# Patient Record
Sex: Female | Born: 1975 | Race: White | Hispanic: No | Marital: Married | Smoking: Never smoker
Health system: Southern US, Community
[De-identification: ages and names within clinical notes are randomized; demographics above are authoritative.]

## PROBLEM LIST (undated history)

## (undated) DIAGNOSIS — R51 Headache: Secondary | ICD-10-CM

## (undated) DIAGNOSIS — N946 Dysmenorrhea, unspecified: Secondary | ICD-10-CM

## (undated) DIAGNOSIS — N939 Abnormal uterine and vaginal bleeding, unspecified: Secondary | ICD-10-CM

## (undated) DIAGNOSIS — N800 Endometriosis of uterus: Secondary | ICD-10-CM

## (undated) DIAGNOSIS — Z90710 Acquired absence of both cervix and uterus: Secondary | ICD-10-CM

## (undated) HISTORY — PX: ENDOMETRIAL ABLATION: SHX621

## (undated) HISTORY — PX: ABDOMINAL HYSTERECTOMY: SHX81

## (undated) HISTORY — PX: TUBAL LIGATION: SHX77

---

## 2005-06-15 ENCOUNTER — Encounter (INDEPENDENT_AMBULATORY_CARE_PROVIDER_SITE_OTHER): Payer: Self-pay | Admitting: General Surgery

## 2005-06-15 ENCOUNTER — Ambulatory Visit (HOSPITAL_COMMUNITY): Admission: RE | Admit: 2005-06-15 | Discharge: 2005-06-15 | Payer: Self-pay | Admitting: General Surgery

## 2006-08-28 HISTORY — PX: LIPOMA EXCISION: SHX5283

## 2007-03-06 ENCOUNTER — Ambulatory Visit (HOSPITAL_COMMUNITY): Admission: RE | Admit: 2007-03-06 | Discharge: 2007-03-06 | Payer: Self-pay | Admitting: Obstetrics and Gynecology

## 2011-01-05 ENCOUNTER — Ambulatory Visit (HOSPITAL_COMMUNITY)
Admission: RE | Admit: 2011-01-05 | Discharge: 2011-01-05 | Disposition: A | Discharge status: Home / Self Care | Payer: BC Managed Care – PPO | Source: Ambulatory Visit | Attending: Pulmonary Disease | Admitting: Pulmonary Disease

## 2011-01-05 ENCOUNTER — Other Ambulatory Visit (HOSPITAL_COMMUNITY): Payer: Self-pay | Admitting: Pulmonary Disease

## 2011-01-05 DIAGNOSIS — M545 Low back pain, unspecified: Secondary | ICD-10-CM | POA: Insufficient documentation

## 2011-01-10 NOTE — Op Note (Signed)
NAMESCHARLENE, [name]                ACCOUNT NO.:  [id number]   MEDICAL RECORD NO.:  [id number]          PATIENT TYPE:  AMB   LOCATION:  SDC                           FACILITY:  [facility]   PHYSICIAN:  Sherron Monday, MD        DATE OF BIRTH:  04/08/1976   DATE OF PROCEDURE:  03/06/2007  DATE OF DISCHARGE:                               OPERATIVE REPORT   PREOPERATIVE DIAGNOSIS:  Menorrhagia, undesired fertility.   POSTOPERATIVE DIAGNOSIS:  Menorrhagia, undesired fertility.   PROCEDURE:  Bilateral tubal ligation by fulguration, ThermaChoice  endometrial ablation.   SURGEON:  Sherron Monday, M.D.   ASSISTANT:  None.   ANESTHESIA:  General as well as local.   SPECIMENS:  None.   ESTIMATED BLOOD LOSS:  Minimal.   IV FLUIDS:  1200 mL   URINE OUTPUT:  I&O cath prior to the procedure.   COMPLICATIONS:  None.   DISPOSITION:  Stable to PACU following the procedure.   PROCEDURE:  After informed consent was reviewed with the patient  including risks, benefits, alternatives of surgical procedure.  She is  taken to the OR room and placed on the table in supine position.  General anesthesia was induced found to be adequate and she was placed  in the yellow fin stirrups, prepped and draped in the normal sterile  fashion.  Her bladder was drained and a open-sided speculum was used to  visualize her uterus and the uterine manipulator was placed without  difficulty.  Gloves were changed and attention was turned to the  abdominal portion of the case. An approximately 10 mm vertical umbilical  incision was made using the Veress needle.  Pneumoperitoneum was  obtained in the typical fashion with testing of peritoneal entry was  with the hanging drop test. Using direct entry the 10 mm scope was  placed at the umbilicus.  She was placed in Trendelenburg. Her anatomy  was reviewed and her pelvic anatomy was visualized.  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 The tubes were  both  visualized followed out to the fimbriated end.  Attention was turned to  the fulguration of the tubes in typical fashion.  The right tube  initially was burned and fulgurized in the three burn technique.  Hemostasis was assured. Attention was then turned to the left tube which  was burned in the three burn technique. Gas was evacuated from the  abdomen and the trocar was removed. The deep tissue was closed with the  0 Vicryl.  The skin was closed with 3-0 Vicryl in a subcuticular  fashion.  The bandage was placed on umbilicus. The attention was then  turned to the vaginal portion of the case.  The uterine manipulator was  removed, bimanual exam was performed revealing a mid position of the  uterus. Uterus was sounded to 8 cm in both horns. The ThermaChoice  correct pressure was obtained.  The ThermaChoice was placed in the  uterus and a pressure of 160-180 was obtained.  The ThermaChoice set at  8 minutes cycle, after the fluid  was cooled it was removed and she was  noted to have minimal bleeding from site of the single-tooth tenaculum.  Pressure was held.  This was noted to be stopped.  She was awoken in  stable condition and transferred to the PACU following procedure.      Sherron Monday, MD  Electronically Signed     JB/MEDQ  D:  03/06/2007  T:  03/06/2007  Job:  [id number]

## 2011-01-10 NOTE — H&P (Signed)
[name], [name]                ACCOUNT NO.:  [id number]   MEDICAL RECORD NO.:  [id number]          PATIENT TYPE:  AMB   LOCATION:  SDC                           [facility]ACILITY:  [facility]   PHYSICIAN:  Sherron Monday, MD        DATE OF BIRTH:  1976-05-31   DATE OF ADMISSION:  03/06/2007  DATE OF DISCHARGE:                              HISTORY & PHYSICAL   PROCEDURE:  ThermaChoice endometrial ablation and bilateral tubal  ligation by fulguration.   HISTORY OF PRESENT ILLNESS:  This 35 year old with heavy menstrual  cycles, desiring relief and permanent sterilization.  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  We discussed  ThermaChoice and investigated that this is not a contraindication for  this procedure.   PAST MEDICAL HISTORY:  Significant for her bicornuate uterus.   PAST SURGICAL HISTORY:  1. Significant for a low transverse cesarean section.  2. Lipoma removal from her back.   OB/GYN HISTORY:  She is a G 2 P 2, status post a cesarean section for  breech, and a vaginal delivery.  She has no history of any abnormal Pap  smears, no sexually transmitted diseases.   ALLERGIES:  No known drug allergies.   SOCIAL HISTORY:  Denies alcohol, tobacco or drug use.  She is married  and works for the [employer].   FAMILY HISTORY:  Significant for hypertension in her mother and father.  No diabetes, no coronary artery disease.  History of paternal  grandfather with pancreatic cancer.  No breast, colon or ovarian cancer  in her family.   PHYSICAL EXAMINATION:  VITAL SIGNS:  Afebrile.  Vital signs stable.  GENERAL:  In no apparent distress.  HEENT:  Mucous membranes moist.  NECK:  No lymphadenopathy.  Thyroid  within normal limits.  HEART:  A regular rate and rhythm.  LUNGS:  Clear to auscultation bilaterally.  BACK:  No costovertebral angle tenderness.  BREASTS:  No masses, nontender.  No distortion.  ABDOMEN:  Soft, nontender, non-distended.  Positive bowel sounds.  EXTREMITIES:  Symmetric and nontender.  PELVIC:  Normal external female genitalia.  Normal Bartholin's, urethral  and Skene's.  Cervix and vaginal without lesions.  No cervical motion  tenderness.  Normal uterus, normal adnexa.   ASSESSMENT/PLAN:  As mentioned, the patient desires an endometrial  ablation for menorrhagia and a permanent sterilization.  We did discuss  the risks, benefits and alternatives of the surgical procedure,  including bleeding, infection, damage to surrounding organs, as well as  a failure rate with the tubal ligation.  She voices understanding and  wishes to proceed with a ThermaChoice and a bilateral tubal ligation.      Sherron Monday, MD  Electronically Signed     JB/MEDQ  D:  03/05/2007  T:  03/05/2007  Job:  [id number]

## 2011-01-13 NOTE — Op Note (Signed)
[name], [name]                ACCOUNT NO.:  [id number]   MEDICAL RECORD NO.:  [id number]          PATIENT TYPE:  AMB   LOCATION:  DAY                           FACILITY:  [facility]   PHYSICIAN:  Dalia Heading, M.D.  DATE OF BIRTH:  05-Aug-1976   DATE OF PROCEDURE:  06/15/2005  DATE OF DISCHARGE:                                 OPERATIVE REPORT   PREOPERATIVE DIAGNOSIS:  Neoplasm, back.   POSTOPERATIVE DIAGNOSIS:  Neoplasm, back.   PROCEDURE:  Excision of subcutaneous neoplasm, back.   SURGEON:  Dr. Franky Macho.   ANESTHESIA:  General endotracheal.   INDICATIONS:  The patient is a 35 year old white female who presents with an  enlarging subcutaneous mass along the mid left back. The patient now comes  the operating room for excision of the mass. Risks and benefits of the  procedure were fully explained to the patient, who gave informed consent.   PROCEDURE NOTE:  The patient was placed in the right lateral decubitus  position after induction of general endotracheal anesthesia. The back was  prepped and draped using the usual sterile technique with Betadine. Surgical  site confirmation was performed.   A longitudinal incision was made over the mass which measured greater than 8  cm in its greatest diameter. The dissection was taken down to the mass. The  mass was excised without difficulty. It appeared to be a lipoma. Further  pathology is pending. The wound was irrigated normal saline. Bleeding was  controlled using Bovie electrocautery. The subcutaneous layer was  reapproximated using a 3-0 Vicryl interrupted suture. The skin was closed  using a 4-0 Vicryl subcuticular suture. Sensorcaine 0.5% was instilled into  the surrounding wound. Dermabond was then applied.   All tape and needle counts were correct at the end of the procedure. The  patient was extubated in the operating room and went back to recovery room  awake in stable condition.   COMPLICATIONS:  None.   SPECIMEN:  Mass, back.   BLOOD LOSS:  Minimal.      Dalia Heading, M.D.  Electronically Signed     MAJ/MEDQ  D:  06/15/2005  T:  06/15/2005  Job:  [id number]   cc:   Ramon Dredge L. Juanetta Gosling, M.D.  Fax: [phone]

## 2011-01-13 NOTE — H&P (Signed)
[name], [name]                ACCOUNT NO.:  [id number]   MEDICAL RECORD NO.:  [id number]          PATIENT TYPE:  AMB   LOCATION:  DAY                           FACILITY:  [facility]   PHYSICIAN:  [name], M.D.  DATE OF BIRTH:  February 23, 1976   DATE OF ADMISSION:  DATE OF DISCHARGE:  LH                                HISTORY & PHYSICAL   CHIEF COMPLAINT:  Mass, back.   HISTORY OF PRESENT ILLNESS:  The patient is a 35 year old white female who  is referred for evaluation and treatment of a mass on her back. It has been  present for some time but recently has increased in size and is causing some  discomfort.   PAST MEDICAL HISTORY:  Unremarkable.   PAST SURGICAL HISTORY:  Cesarean section.   CURRENT MEDICATIONS:  Birth control pills.   ALLERGIES:  No known drug allergies.   REVIEW OF SYSTEMS:  Noncontributory.   PHYSICAL EXAMINATION:  GENERAL:  The patient is a well-developed, well-  nourished, white female in no acute distress.  LUNGS:  Clear to auscultation without equal breath sounds bilaterally.  HEART:  Reveals a regular _______________ noted along the left mid back.   IMPRESSION:  Neoplasm, back, unspecified.   PLAN:  The patient is scheduled for excision of the neoplasm, back, on  June 15, 2005. The risks and benefits of the procedure including  bleeding, infection, and recurrence of the mass were fully explained to the  patient who gave informed consent.      [name], M.D.  Electronically Signed     MAJ/MEDQ  D:  06/13/2005  T:  06/13/2005  Job:  [id number]   cc:   Ramon Dredge L. Juanetta Gosling, M.D.  Fax: [phone]   [facility]  Fax: [phone]

## 2011-06-13 LAB — CBC
MCHC: 33.5
MCV: 79
RDW: 14.2 — ABNORMAL HIGH

## 2011-06-29 ENCOUNTER — Other Ambulatory Visit (HOSPITAL_COMMUNITY): Payer: Self-pay | Admitting: Physical Medicine and Rehabilitation

## 2011-06-29 DIAGNOSIS — IMO0002 Reserved for concepts with insufficient information to code with codable children: Secondary | ICD-10-CM

## 2011-06-29 DIAGNOSIS — M5137 Other intervertebral disc degeneration, lumbosacral region: Secondary | ICD-10-CM

## 2011-06-30 ENCOUNTER — Ambulatory Visit (HOSPITAL_COMMUNITY)
Admission: RE | Admit: 2011-06-30 | Discharge: 2011-06-30 | Disposition: A | Discharge status: Home / Self Care | Payer: BC Managed Care – PPO | Source: Ambulatory Visit | Attending: Physical Medicine and Rehabilitation | Admitting: Physical Medicine and Rehabilitation

## 2011-06-30 DIAGNOSIS — M545 Low back pain, unspecified: Secondary | ICD-10-CM | POA: Insufficient documentation

## 2011-06-30 DIAGNOSIS — M546 Pain in thoracic spine: Secondary | ICD-10-CM | POA: Insufficient documentation

## 2011-06-30 DIAGNOSIS — M5137 Other intervertebral disc degeneration, lumbosacral region: Secondary | ICD-10-CM

## 2011-06-30 DIAGNOSIS — IMO0002 Reserved for concepts with insufficient information to code with codable children: Secondary | ICD-10-CM

## 2011-10-27 ENCOUNTER — Encounter (HOSPITAL_COMMUNITY): Payer: Self-pay

## 2011-11-08 ENCOUNTER — Encounter (HOSPITAL_COMMUNITY)
Admission: RE | Admit: 2011-11-08 | Discharge: 2011-11-08 | Disposition: A | Discharge status: Home / Self Care | Payer: BC Managed Care – PPO | Source: Ambulatory Visit | Attending: Obstetrics and Gynecology | Admitting: Obstetrics and Gynecology

## 2011-11-08 ENCOUNTER — Encounter (HOSPITAL_COMMUNITY): Payer: Self-pay

## 2011-11-08 HISTORY — DX: Headache: R51

## 2011-11-08 LAB — CBC
HCT: 36.1 % (ref 36.0–46.0)
Hemoglobin: 12.5 g/dL (ref 12.0–15.0)
MCH: 29.7 pg (ref 26.0–34.0)
MCV: 85.7 fL (ref 78.0–100.0)
RBC: 4.21 MIL/uL (ref 3.87–5.11)
WBC: 8.2 10*3/uL (ref 4.0–10.5)

## 2011-11-08 NOTE — Patient Instructions (Addendum)
20 [name]  11/08/2011   Your procedure is scheduled on:  11/16/11  Enter through the Main Entrance of [facility] at 830 AM.  Pick up the phone at the desk and dial [phone].   Call this number if you have problems the morning of surgery: [phone]   Remember:   Do not eat food:After Midnight.  Do not drink clear liquids: After Midnight.  Take these medicines the morning of surgery with A SIP OF WATER: Topamax   Do not wear jewelry, make-up or nail polish.  Do not wear lotions, powders, or perfumes. You may wear deodorant.  Do not shave 48 hours prior to surgery.  Do not bring valuables to the hospital.  Contacts, dentures or bridgework may not be worn into surgery.  Leave suitcase in the car. After surgery it may be brought to your room.  For patients admitted to the hospital, checkout time is 11:00 AM the day of discharge.   Patients discharged the day of surgery will not be allowed to drive home.  Name and phone number of your driver: NA  Special Instructions: CHG Shower Use Special Wash: 1/2 bottle night before surgery and 1/2 bottle morning of surgery.   Please read over the following fact sheets that you were given: MRSA Information

## 2011-11-16 ENCOUNTER — Ambulatory Visit (HOSPITAL_COMMUNITY)
Admission: RE | Admit: 2011-11-16 | Discharge: 2011-11-17 | Disposition: A | Discharge status: Home / Self Care | Payer: BC Managed Care – PPO | Source: Ambulatory Visit | Attending: Obstetrics and Gynecology | Admitting: Obstetrics and Gynecology

## 2011-11-16 ENCOUNTER — Encounter (HOSPITAL_COMMUNITY): Payer: Self-pay | Admitting: Registered Nurse

## 2011-11-16 ENCOUNTER — Encounter (HOSPITAL_COMMUNITY): Payer: Self-pay | Admitting: Obstetrics and Gynecology

## 2011-11-16 ENCOUNTER — Ambulatory Visit (HOSPITAL_COMMUNITY): Payer: BC Managed Care – PPO | Admitting: Registered Nurse

## 2011-11-16 ENCOUNTER — Encounter (HOSPITAL_COMMUNITY)
Admission: RE | Disposition: A | Discharge status: Home / Self Care | Payer: Self-pay | Source: Ambulatory Visit | Attending: Obstetrics and Gynecology

## 2011-11-16 DIAGNOSIS — N938 Other specified abnormal uterine and vaginal bleeding: Secondary | ICD-10-CM | POA: Insufficient documentation

## 2011-11-16 DIAGNOSIS — Q513 Bicornate uterus: Secondary | ICD-10-CM

## 2011-11-16 DIAGNOSIS — Z90710 Acquired absence of both cervix and uterus: Secondary | ICD-10-CM

## 2011-11-16 DIAGNOSIS — N8000 Endometriosis of the uterus, unspecified: Secondary | ICD-10-CM | POA: Insufficient documentation

## 2011-11-16 DIAGNOSIS — N949 Unspecified condition associated with female genital organs and menstrual cycle: Secondary | ICD-10-CM | POA: Insufficient documentation

## 2011-11-16 DIAGNOSIS — N840 Polyp of corpus uteri: Secondary | ICD-10-CM

## 2011-11-16 DIAGNOSIS — Z01812 Encounter for preprocedural laboratory examination: Secondary | ICD-10-CM | POA: Insufficient documentation

## 2011-11-16 DIAGNOSIS — N939 Abnormal uterine and vaginal bleeding, unspecified: Secondary | ICD-10-CM

## 2011-11-16 DIAGNOSIS — Z01818 Encounter for other preprocedural examination: Secondary | ICD-10-CM | POA: Insufficient documentation

## 2011-11-16 DIAGNOSIS — N8003 Adenomyosis of the uterus: Secondary | ICD-10-CM | POA: Diagnosis present

## 2011-11-16 DIAGNOSIS — N946 Dysmenorrhea, unspecified: Secondary | ICD-10-CM | POA: Insufficient documentation

## 2011-11-16 HISTORY — DX: Dysmenorrhea, unspecified: N94.6

## 2011-11-16 HISTORY — DX: Polyp of corpus uteri: N84.0

## 2011-11-16 HISTORY — DX: Endometriosis of uterus: N80.0

## 2011-11-16 HISTORY — DX: Endometriosis of the uterus, unspecified: N80.00

## 2011-11-16 HISTORY — DX: Acquired absence of both cervix and uterus: Z90.710

## 2011-11-16 HISTORY — DX: Abnormal uterine and vaginal bleeding, unspecified: N93.9

## 2011-11-16 HISTORY — PX: LAPAROSCOPIC ASSISTED VAGINAL HYSTERECTOMY: SHX5398

## 2011-11-16 HISTORY — DX: Bicornate uterus: Q51.3

## 2011-11-16 HISTORY — PX: SALPINGOOPHORECTOMY: SHX82

## 2011-11-16 LAB — BASIC METABOLIC PANEL
Calcium: 9.2 mg/dL (ref 8.4–10.5)
Chloride: 108 mEq/L (ref 96–112)
Creatinine, Ser: 0.59 mg/dL (ref 0.50–1.10)
GFR calc Af Amer: >90 mL/min (ref >90)
Sodium: 139 mEq/L (ref 135–145)

## 2011-11-16 LAB — PREGNANCY, URINE: Preg Test, Ur: NEGATIVE

## 2011-11-16 SURGERY — HYSTERECTOMY, VAGINAL, LAPAROSCOPY-ASSISTED
Anesthesia: General | Site: Abdomen | Wound class: Clean Contaminated

## 2011-11-16 MED ORDER — PROPOFOL 10 MG/ML IV EMUL
INTRAVENOUS | Status: DC | PRN
  Administered 2011-11-16: 170 mg via INTRAVENOUS

## 2011-11-16 MED ORDER — MORPHINE SULFATE 10 MG/ML IJ SOLN
INTRAMUSCULAR | Status: AC
  Filled 2011-11-16: qty 1

## 2011-11-16 MED ORDER — SODIUM CHLORIDE 0.9 % IJ SOLN: 9.0000 mL | INTRAMUSCULAR | Status: DC | PRN

## 2011-11-16 MED ORDER — EPHEDRINE SULFATE 50 MG/ML IJ SOLN
10.0000 mg | Freq: Once | INTRAMUSCULAR | Status: AC
  Administered 2011-11-16: 10 mg via INTRAVENOUS

## 2011-11-16 MED ORDER — BUPIVACAINE HCL (PF) 0.25 % IJ SOLN
INTRAMUSCULAR | Status: AC
  Filled 2011-11-16: qty 30

## 2011-11-16 MED ORDER — MEPERIDINE HCL 25 MG/ML IJ SOLN: 6.2500 mg | INTRAMUSCULAR | Status: DC | PRN

## 2011-11-16 MED ORDER — INDIGOTINDISULFONATE SODIUM 8 MG/ML IJ SOLN
INTRAMUSCULAR | Status: AC
  Filled 2011-11-16: qty 5

## 2011-11-16 MED ORDER — CEFAZOLIN SODIUM-DEXTROSE 2-3 GM-% IV SOLR
2.0000 g | INTRAVENOUS | Status: AC
  Administered 2011-11-16: 2 g via INTRAVENOUS
  Filled 2011-11-16: qty 50

## 2011-11-16 MED ORDER — GLYCOPYRROLATE 0.2 MG/ML IJ SOLN
INTRAMUSCULAR | Status: AC
  Filled 2011-11-16: qty 1

## 2011-11-16 MED ORDER — HYDROMORPHONE HCL PF 1 MG/ML IJ SOLN
0.2500 mg | INTRAMUSCULAR | Status: DC | PRN
  Administered 2011-11-16: 0.5 mg via INTRAVENOUS

## 2011-11-16 MED ORDER — LIDOCAINE HCL (CARDIAC) 20 MG/ML IV SOLN
INTRAVENOUS | Status: DC | PRN
  Administered 2011-11-16: 60 mg via INTRAVENOUS

## 2011-11-16 MED ORDER — IBUPROFEN 800 MG PO TABS
800.0000 mg | ORAL_TABLET | Freq: Three times a day (TID) | ORAL | Status: DC | PRN
  Administered 2011-11-17: 800 mg via ORAL
  Filled 2011-11-16: qty 1

## 2011-11-16 MED ORDER — FENTANYL CITRATE 0.05 MG/ML IJ SOLN
INTRAMUSCULAR | Status: DC | PRN
  Administered 2011-11-16: 50 ug via INTRAVENOUS
  Administered 2011-11-16: 150 ug via INTRAVENOUS
  Administered 2011-11-16: 50 ug via INTRAVENOUS

## 2011-11-16 MED ORDER — ALUM & MAG HYDROXIDE-SIMETH 200-200-20 MG/5ML PO SUSP: 30.0000 mL | ORAL | Status: DC | PRN

## 2011-11-16 MED ORDER — ONDANSETRON HCL 4 MG/2ML IJ SOLN
INTRAMUSCULAR | Status: DC | PRN
  Administered 2011-11-16: 4 mg via INTRAVENOUS

## 2011-11-16 MED ORDER — OXYCODONE-ACETAMINOPHEN 5-325 MG PO TABS: 1.0000 | ORAL_TABLET | ORAL | Status: DC | PRN

## 2011-11-16 MED ORDER — GUAIFENESIN 100 MG/5ML PO SOLN: 15.0000 mL | ORAL | Status: DC | PRN

## 2011-11-16 MED ORDER — LACTATED RINGERS IV SOLN
INTRAVENOUS | Status: DC
  Administered 2011-11-16 (×3): via INTRAVENOUS

## 2011-11-16 MED ORDER — ZOLPIDEM TARTRATE 5 MG PO TABS: 5.0000 mg | ORAL_TABLET | Freq: Every evening | ORAL | Status: DC | PRN

## 2011-11-16 MED ORDER — HYDROMORPHONE HCL PF 1 MG/ML IJ SOLN
INTRAMUSCULAR | Status: AC
  Filled 2011-11-16: qty 1

## 2011-11-16 MED ORDER — KETOROLAC TROMETHAMINE 30 MG/ML IJ SOLN: 15.0000 mg | Freq: Once | INTRAMUSCULAR | Status: DC | PRN

## 2011-11-16 MED ORDER — NALOXONE HCL 0.4 MG/ML IJ SOLN: 0.4000 mg | INTRAMUSCULAR | Status: DC | PRN

## 2011-11-16 MED ORDER — MORPHINE SULFATE 10 MG/ML IJ SOLN
INTRAMUSCULAR | Status: DC | PRN
  Administered 2011-11-16 (×2): 5 mg via INTRAVENOUS

## 2011-11-16 MED ORDER — DEXAMETHASONE SODIUM PHOSPHATE 10 MG/ML IJ SOLN
INTRAMUSCULAR | Status: DC | PRN
  Administered 2011-11-16: 10 mg via INTRAVENOUS

## 2011-11-16 MED ORDER — LACTATED RINGERS IV SOLN
INTRAVENOUS | Status: DC
  Administered 2011-11-16: 14:00:00 via INTRAVENOUS

## 2011-11-16 MED ORDER — MIDAZOLAM HCL 5 MG/5ML IJ SOLN
INTRAMUSCULAR | Status: DC | PRN
  Administered 2011-11-16: 2 mg via INTRAVENOUS

## 2011-11-16 MED ORDER — ONDANSETRON HCL 4 MG/2ML IJ SOLN: 4.0000 mg | Freq: Four times a day (QID) | INTRAMUSCULAR | Status: DC | PRN

## 2011-11-16 MED ORDER — PROPOFOL 10 MG/ML IV EMUL
INTRAVENOUS | Status: AC
  Filled 2011-11-16: qty 20

## 2011-11-16 MED ORDER — MENTHOL 3 MG MT LOZG: 1.0000 | LOZENGE | OROMUCOSAL | Status: DC | PRN

## 2011-11-16 MED ORDER — DIPHENHYDRAMINE HCL 50 MG/ML IJ SOLN: 12.5000 mg | Freq: Four times a day (QID) | INTRAMUSCULAR | Status: DC | PRN

## 2011-11-16 MED ORDER — GLYCOPYRROLATE 0.2 MG/ML IJ SOLN
INTRAMUSCULAR | Status: DC | PRN
  Administered 2011-11-16: 0.3 mg via INTRAVENOUS

## 2011-11-16 MED ORDER — NEOSTIGMINE METHYLSULFATE 1 MG/ML IJ SOLN
INTRAMUSCULAR | Status: DC | PRN
  Administered 2011-11-16: 2 mg via INTRAVENOUS

## 2011-11-16 MED ORDER — MIDAZOLAM HCL 2 MG/2ML IJ SOLN
INTRAMUSCULAR | Status: AC
  Filled 2011-11-16: qty 2

## 2011-11-16 MED ORDER — TOPIRAMATE 25 MG PO TABS
75.0000 mg | ORAL_TABLET | Freq: Two times a day (BID) | ORAL | Status: DC
  Administered 2011-11-16 – 2011-11-17 (×3): 75 mg via ORAL
  Filled 2011-11-16 (×3): qty 3

## 2011-11-16 MED ORDER — ONDANSETRON HCL 4 MG/2ML IJ SOLN
INTRAMUSCULAR | Status: AC
  Filled 2011-11-16: qty 2

## 2011-11-16 MED ORDER — DIPHENHYDRAMINE HCL 12.5 MG/5ML PO ELIX: 12.5000 mg | ORAL_SOLUTION | Freq: Four times a day (QID) | ORAL | Status: DC | PRN

## 2011-11-16 MED ORDER — ROCURONIUM BROMIDE 100 MG/10ML IV SOLN
INTRAVENOUS | Status: DC | PRN
  Administered 2011-11-16: 40 mg via INTRAVENOUS

## 2011-11-16 MED ORDER — LIDOCAINE HCL (CARDIAC) 20 MG/ML IV SOLN
INTRAVENOUS | Status: AC
  Filled 2011-11-16: qty 5

## 2011-11-16 MED ORDER — ONDANSETRON HCL 4 MG/2ML IJ SOLN
4.0000 mg | Freq: Four times a day (QID) | INTRAMUSCULAR | Status: DC | PRN
  Administered 2011-11-16: 4 mg via INTRAVENOUS
  Filled 2011-11-16: qty 2

## 2011-11-16 MED ORDER — SIMETHICONE 80 MG PO CHEW: 80.0000 mg | CHEWABLE_TABLET | Freq: Four times a day (QID) | ORAL | Status: DC | PRN

## 2011-11-16 MED ORDER — ONDANSETRON HCL 4 MG PO TABS: 4.0000 mg | ORAL_TABLET | Freq: Four times a day (QID) | ORAL | Status: DC | PRN

## 2011-11-16 MED ORDER — ROCURONIUM BROMIDE 50 MG/5ML IV SOLN
INTRAVENOUS | Status: AC
  Filled 2011-11-16: qty 1

## 2011-11-16 MED ORDER — FENTANYL CITRATE 0.05 MG/ML IJ SOLN
INTRAMUSCULAR | Status: AC
  Filled 2011-11-16: qty 5

## 2011-11-16 MED ORDER — BUPIVACAINE HCL (PF) 0.25 % IJ SOLN
INTRAMUSCULAR | Status: DC | PRN
  Administered 2011-11-16: 9 mL

## 2011-11-16 MED ORDER — PROMETHAZINE HCL 25 MG/ML IJ SOLN: 6.2500 mg | INTRAMUSCULAR | Status: DC | PRN

## 2011-11-16 MED ORDER — MORPHINE SULFATE (PF) 1 MG/ML IV SOLN
INTRAVENOUS | Status: DC
  Administered 2011-11-16: 13 mg via INTRAVENOUS
  Administered 2011-11-16 (×2): via INTRAVENOUS
  Administered 2011-11-16: 22 mg via INTRAVENOUS
  Administered 2011-11-17: 3 mg via INTRAVENOUS
  Administered 2011-11-17: 05:00:00 via INTRAVENOUS
  Administered 2011-11-17: 8 mg via INTRAVENOUS
  Administered 2011-11-17: 9 mg via INTRAVENOUS
  Filled 2011-11-16 (×3): qty 25

## 2011-11-16 MED ORDER — EPHEDRINE SULFATE 50 MG/ML IJ SOLN
INTRAMUSCULAR | Status: AC
  Administered 2011-11-16: 10 mg via INTRAVENOUS
  Filled 2011-11-16: qty 1

## 2011-11-16 MED ORDER — DEXAMETHASONE SODIUM PHOSPHATE 10 MG/ML IJ SOLN
INTRAMUSCULAR | Status: AC
  Filled 2011-11-16: qty 1

## 2011-11-16 MED ORDER — KETOROLAC TROMETHAMINE 30 MG/ML IJ SOLN
INTRAMUSCULAR | Status: AC
  Filled 2011-11-16: qty 1

## 2011-11-16 MED ORDER — KETOROLAC TROMETHAMINE 30 MG/ML IJ SOLN
INTRAMUSCULAR | Status: DC | PRN
  Administered 2011-11-16: 30 mg via INTRAVENOUS

## 2011-11-16 SURGICAL SUPPLY — 35 items
CABLE HIGH FREQUENCY MONO STRZ (ELECTRODE) IMPLANT
CHLORAPREP W/TINT 26ML (MISCELLANEOUS) ×3 IMPLANT
CLOTH BEACON ORANGE TIMEOUT ST (SAFETY) ×3 IMPLANT
CONT PATH 16OZ SNAP LID 3702 (MISCELLANEOUS) ×3 IMPLANT
COVER TABLE BACK 60X90 (DRAPES) ×3 IMPLANT
DECANTER SPIKE VIAL GLASS SM (MISCELLANEOUS) ×3 IMPLANT
DRSG COVADERM PLUS 2X2 (GAUZE/BANDAGES/DRESSINGS) ×3 IMPLANT
ELECT REM PT RETURN 9FT ADLT (ELECTROSURGICAL) ×3
ELECTRODE REM PT RTRN 9FT ADLT (ELECTROSURGICAL) ×2 IMPLANT
EVACUATOR PREFILTER SMOKE (MISCELLANEOUS) ×3 IMPLANT
GLOVE BIO SURGEON STRL SZ 6.5 (GLOVE) ×12 IMPLANT
GLOVE BIO SURGEON STRL SZ7 (GLOVE) ×6 IMPLANT
GLOVE ORTHO TXT STRL SZ7.5 (GLOVE) ×3 IMPLANT
GOWN PREVENTION PLUS LG XLONG (DISPOSABLE) ×9 IMPLANT
GOWN SURGICAL XLG (GOWNS) ×12 IMPLANT
NEEDLE INSUFFLATION 14GA 120MM (NEEDLE) ×3 IMPLANT
NS IRRIG 1000ML POUR BTL (IV SOLUTION) ×3 IMPLANT
PACK LAVH (CUSTOM PROCEDURE TRAY) ×3 IMPLANT
PROTECTOR NERVE ULNAR (MISCELLANEOUS) ×3 IMPLANT
SCALPEL HARMONIC ACE (MISCELLANEOUS) ×3 IMPLANT
SET CYSTO W/LG BORE CLAMP LF (SET/KITS/TRAYS/PACK) IMPLANT
SET IRRIG TUBING LAPAROSCOPIC (IRRIGATION / IRRIGATOR) IMPLANT
STRIP CLOSURE SKIN 1/4X3 (GAUZE/BANDAGES/DRESSINGS) IMPLANT
SUT VIC AB 1 CT1 18XBRD ANBCTR (SUTURE) ×4 IMPLANT
SUT VIC AB 1 CT1 8-18 (SUTURE) ×2
SUT VIC AB 2-0 CT1 (SUTURE) ×3 IMPLANT
SUT VICRYL 0 TIES 12 18 (SUTURE) ×3 IMPLANT
SUT VICRYL 0 UR6 27IN ABS (SUTURE) IMPLANT
SUT VICRYL 4-0 PS2 18IN ABS (SUTURE) ×3 IMPLANT
SYR 50ML LL SCALE MARK (SYRINGE) ×3 IMPLANT
TOWEL OR 17X24 6PK STRL BLUE (TOWEL DISPOSABLE) ×9 IMPLANT
TRAY FOLEY CATH 14FR (SET/KITS/TRAYS/PACK) ×3 IMPLANT
TROCAR XCEL NON-BLD 5MMX100MML (ENDOMECHANICALS) ×9 IMPLANT
WARMER LAPAROSCOPE (MISCELLANEOUS) ×3 IMPLANT
WATER STERILE IRR 1000ML POUR (IV SOLUTION) ×3 IMPLANT

## 2011-11-16 NOTE — Anesthesia Postprocedure Evaluation (Signed)
Anesthesia Post Note  Patient: [name]  Procedure(s) Performed: Procedure(s) (LRB): LAPAROSCOPIC ASSISTED VAGINAL HYSTERECTOMY (N/A) SALPINGO OOPHERECTOMY (Bilateral)  Anesthesia type: General  Patient location: PACU  Post pain: Pain level controlled  Post assessment: Post-op Vital signs reviewed  Last Vitals:  Filed Vitals:   11/16/11 0841  BP: 137/88  Pulse: 80  Temp: 36.7 C  Resp: 18    Post vital signs: Reviewed  Level of consciousness: sedated  Complications: No apparent anesthesia complicationsfj

## 2011-11-16 NOTE — Preoperative (Signed)
Beta Blockers   Reason not to administer Beta Blockers:Not Applicable 

## 2011-11-16 NOTE — Progress Notes (Signed)
Patient ID: [name], female   DOB: 1976-08-10, 36 y.o.   MRN: [id number] VS stable Output good Urine is yellow. No complaints

## 2011-11-16 NOTE — Addendum Note (Signed)
Addendum  created 11/16/11 1626 by Graciela Husbands, CRNA   Modules edited:Notes Section

## 2011-11-16 NOTE — Transfer of Care (Signed)
Immediate Anesthesia Transfer of Care Note  Patient: [name]  Procedure(s) Performed: Procedure(s) (LRB): LAPAROSCOPIC ASSISTED VAGINAL HYSTERECTOMY (N/A) SALPINGO OOPHERECTOMY (Bilateral)  Patient Location: PACU  Anesthesia Type: General  Level of Consciousness: awake, alert  and patient cooperative  Airway & Oxygen Therapy: Patient Spontanous Breathing and Patient connected to nasal cannula oxygen  Post-op Assessment: Report given to PACU RN  Post vital signs: Reviewed  Complications: No apparent anesthesia complications

## 2011-11-16 NOTE — Brief Op Note (Signed)
11/16/2011  11:48 AM  PATIENT:  [name]  36 y.o. female  PRE-OPERATIVE DIAGNOSIS:  biounuate uterus, adenomyosis, dysmenorrhea, failed endometrial ablation  POST-OPERATIVE DIAGNOSIS:  biounuate uterus, adenomyosis, dysmenorrhea, failed endometrial ablation, s/p LAVH BSO  PROCEDURE:  Procedure(s) (LRB): LAPAROSCOPIC ASSISTED VAGINAL HYSTERECTOMY (N/A) SALPINGO OOPHERECTOMY (Bilateral)  SURGEON:  Surgeon(s) and Role:    * Sherron Monday, MD - Primary  ASSISTANTS: Lavina Hamman, MD   ANESTHESIA:   general  EBL:  Total I/O In: 2100 [I.V.:2100] Out: 350 [Urine:100; Blood:250]  BLOOD ADMINISTERED:none  DRAINS: none   LOCAL MEDICATIONS USED:  MARCAINE     SPECIMEN:  Source of Specimen:  uterus, cervix and Bilateral tubes and ovaries  DISPOSITION OF SPECIMEN:  PATHOLOGY  COUNTS:  YES  TOURNIQUET:  * No tourniquets in log *  DICTATION: .Other Dictation: Dictation Number [id number]  PLAN OF CARE: Admit for overnight observation  PATIENT DISPOSITION:  PACU - hemodynamically stable.   Delay start of Pharmacological VTE agent (>24hrs) due to surgical blood loss or risk of bleeding: not applicable

## 2011-11-16 NOTE — Anesthesia Preprocedure Evaluation (Signed)
Anesthesia Evaluation  Patient identified by MRN, date of birth, ID band Patient awake    Reviewed: Allergy & Precautions, H&P , NPO status , Patient's Chart, lab work & pertinent test results  Airway Mallampati: I TM Distance: >3 FB Neck ROM: full    Dental No notable dental hx. (+) Teeth Intact   Pulmonary neg pulmonary ROS,    Pulmonary exam normal       Cardiovascular negative cardio ROS      Neuro/Psych  Headaches, negative psych ROS   GI/Hepatic negative GI ROS, Neg liver ROS,   Endo/Other  negative endocrine ROS  Renal/GU negative Renal ROS  negative genitourinary   Musculoskeletal negative musculoskeletal ROS (+)   Abdominal Normal abdominal exam  (+)   Peds negative pediatric ROS (+)  Hematology negative hematology ROS (+)   Anesthesia Other Findings   Reproductive/Obstetrics negative OB ROS                           Anesthesia Physical Anesthesia Plan  ASA: II  Anesthesia Plan: General   Post-op Pain Management:    Induction: Intravenous  Airway Management Planned: Oral ETT  Additional Equipment:   Intra-op Plan:   Post-operative Plan: Extubation in OR  Informed Consent: I have reviewed the patients History and Physical, chart, labs and discussed the procedure including the risks, benefits and alternatives for the proposed anesthesia with the patient or authorized representative who has indicated his/her understanding and acceptance.   Dental Advisory Given  Plan Discussed with: CRNA and Surgeon  Anesthesia Plan Comments:         Anesthesia Quick Evaluation

## 2011-11-16 NOTE — Anesthesia Postprocedure Evaluation (Signed)
  Anesthesia Post-op Note  Patient: [name]  Procedure(s) Performed: Procedure(s) (LRB): LAPAROSCOPIC ASSISTED VAGINAL HYSTERECTOMY (N/A) SALPINGO OOPHERECTOMY (Bilateral)  Patient Location: 312  Anesthesia Type: General  Level of Consciousness: awake, alert  and oriented  Airway and Oxygen Therapy: Patient Spontanous Breathing and Patient connected to nasal cannula oxygen  Post-op Pain: mild  Post-op Assessment: Post-op Vital signs reviewed and Patient's Cardiovascular Status Stable  Post-op Vital Signs: Reviewed and stable  Complications: No apparent anesthesia complications

## 2011-11-16 NOTE — H&P (Addendum)
Anne-Marie Genson Kolodziejski is an 36 y.o. female G2P2002 with abnormal uterine bleeding, aednomyosis, and dysmenorrhea with failed endometrial ablation for LAVH +/- BSO.  Desires definitive management after discussion of R/B/A.    Pertinent Gynecological History: G2P2, with LTCS and VBAC With AUB, endo polyp, bicornuate uterus, failed endometrial ablation Noh/o abnormal pap, no STDs    Past Medical History  Diagnosis Date  . Headache     migraines  . Dysmenorrhea 11/16/2011  . Abnormal uterine bleeding 11/16/2011  . Uterus, adenomyosis 11/16/2011  back pain  Past Surgical History  Procedure Date  . Cesarean section 1995  . Lipoma excision 2008  . Tubal ligation   . Endometrial ablation     No family history on file.  Social History:  reports that she has never smoked. She does not have any smokeless tobacco history on file. She reports that she drinks alcohol. She reports that she does not use illicit drugs.married  Allergies: No Known Allergies  No prescriptions prior to admission    Review of Systems  Constitutional: Negative.   HENT: Negative.   Eyes: Negative.   Respiratory: Negative.   Cardiovascular: Negative.   Gastrointestinal: Negative.   Genitourinary: Negative.   Musculoskeletal: Negative.   Skin: Negative.   Neurological: Negative.     There were no vitals taken for this visit. Physical Exam  Constitutional: She is oriented to person, place, and time. She appears well-developed and well-nourished.  HENT:  Head: Normocephalic and atraumatic.  Neck: Normal range of motion. Neck supple. No thyromegaly present.  Cardiovascular: Normal rate and regular rhythm.   Respiratory: Effort normal and breath sounds normal.  GI: Soft. Bowel sounds are normal. She exhibits no distension.  Musculoskeletal: Normal range of motion.  Neurological: She is alert and oriented to person, place, and time.  Skin: Skin is warm and dry.  Psychiatric: She has a normal mood and affect. Her  behavior is normal.   Assessment/Plan: 35yo G2P2002 with AUB, dysmenorrhea, failed endo ablation for definitive management.  Desires LAVH +/- BSO after discussion of R/B/A, will proceed  BOVARD,Jaloni Davoli 11/16/2011, 12:27 AM  reviewed H&P no changes, reviewed surgery including r/b/a, Discussion of BSO and surgical menopause, PT states she definitely wants them removed.  Pt wants to proceed.  Discussion with husband and pt.

## 2011-11-16 NOTE — OR Nursing (Signed)
Late entry for patient postioning related to entry error.

## 2011-11-16 NOTE — Op Note (Signed)
NAMERICQUEL, FOULK                ACCOUNT NO.:  [id number]  MEDICAL RECORD NO.:  [id number]  LOCATION:  [facility]                          FACILITY:  [facility]  PHYSICIAN:  Sherron Monday, MD        DATE OF BIRTH:  1976/06/13  DATE OF PROCEDURE:  11/16/2011 DATE OF DISCHARGE:                              OPERATIVE REPORT   PREOPERATIVE DIAGNOSES: 1. Bicornuate uterus. 2. Adenomyosis. 3. Dysmenorrhea. 4. Failed endometrial ablation.  POSTOPERATIVE DIAGNOSES: 1. Bicornuate uterus. 2. Adenomyosis. 3. Dysmenorrhea. 4. Failed endometrial ablation.  PROCEDURES: 1. Laparoscopic-assisted vaginal hysterectomy. 2. Bilateral salpingectomy oophorectomy.  SURGEON:  Sherron Monday, MD.  ASSISTANT:  Zenaida Niece, MD.  ANESTHESIA:  General.  ESTIMATED BLOOD LOSS:  250 mL.  IV FLUIDS:  1200 mL.  URINE OUTPUT:  100 mL.  Clear urine at the end of the procedure.  COMPLICATIONS:  None.  PATHOLOGY:  Uterus, cervix, and bilateral tubes and ovaries to Pathology.  PROCEDURE IN DETAIL:  After informed consent was reviewed with the patient including the risks, benefits, and alternatives of the surgical procedure with again the discussion of surgical menopause after removal of tubes and ovaries, she again repeated that this was her desire.  She was transferred to the operating room at this time and placed on the table in supine position.  General anesthesia was induced and found to be adequate.  She was then placed in the Yellofin stirrups, prepped and draped in the normal sterile fashion.  A Foley catheter was sterilely placed and a acorn uterine manipulator was placed.  The gloves were changed.  Attention was turned to the abdominal portion of the case. Approximately 1-cm infraumbilical vertical incision was made using the Veress needle.  The peritoneum was entered.  This was checked with the hanging drop test, and the peritoneum was insufflated with direct entry. Trocar was placed at the  umbilicus.  Bilateral accessory ports were placed in the right and left after carefully checking anatomy and where the epigastric vessels were.  The pelvis was surveyed revealing a mildly bicornuate uterus as well as normal tubes and ovaries.  The attention was turned to performing the case with the Ace Harmonic Scalpel.  The IP was incised and in a stepwise fashion, the cardinal ligaments were excised to the level of the uterine artery.  The round ligaments were also incised.  This was done on the opposite side as well.  A bladder flap was created.  Hemostasis was assured.  Attention was turned to the vaginal portion of the case.  The manipulator was removed.  A weighted speculum was placed.  The uterus was grasped with tenaculum and circumscribed with Bovie cautery. Attention was turned to entering anteriorly.  This was aborted and easy posterior entry was performed.  The banana speculum was placed.  The uterosacral ligaments were incised and ligated and held bilaterally, stepwise fashion.  The cardinal ligaments were ligated as well as the uterine arteries.  The uterus was delivered intact.  The pedicles were inspected and found to be hemostatic.  The uterosacral ligaments were plicated with 0 Vicryl, and then the held sutures were tied together. The hemostasis of the cuff was  assured.  The mucosa was closed with 2-0 Vicryl in a running fashion.  Instruments were removed, gloves were changed.  Attention was returned to the abdominal portion of the case. A pelvic survey was performed revealing hemostatic pedicles.  The patient tolerated the procedure well.  Sponge, lap, and needle counts were correct x2 at the end of the procedure.     Sherron Monday, MD     JB/MEDQ  D:  11/16/2011  T:  11/16/2011  Job:  [id number]

## 2011-11-17 LAB — BASIC METABOLIC PANEL
CO2: 24 mEq/L (ref 19–32)
Calcium: 8.5 mg/dL (ref 8.4–10.5)
Creatinine, Ser: 0.55 mg/dL (ref 0.50–1.10)
GFR calc non Af Amer: >90 mL/min (ref >90)

## 2011-11-17 LAB — CBC
MCV: 87.3 fL (ref 78.0–100.0)
Platelets: 138 10*3/uL — ABNORMAL LOW (ref 150–400)
RDW: 12.8 % (ref 11.5–15.5)
WBC: 8.6 10*3/uL (ref 4.0–10.5)

## 2011-11-17 MED ORDER — OXYCODONE-ACETAMINOPHEN 5-325 MG PO TABS
1.0000 | ORAL_TABLET | Freq: Four times a day (QID) | ORAL | Status: AC | PRN
Start: 2011-11-17 — End: 2011-11-27

## 2011-11-17 MED ORDER — IBUPROFEN 800 MG PO TABS: 800.0000 mg | ORAL_TABLET | Freq: Three times a day (TID) | ORAL | Status: AC | PRN

## 2011-11-17 NOTE — Discharge Summary (Signed)
Physician Discharge Summary  Patient ID: [name] MRN: [id number] DOB/AGE: [birth year] 36 y.o.  Admit date: 11/16/2011 Discharge date: 11/17/2011  Admission Diagnoses:dysmenorrhea, AUB, pelvic pain, adenomyosis  Discharge Diagnoses:  Principal Problem:  *S/P laparoscopic assisted vaginal hysterectomy (LAVH) Active Problems:  Dysmenorrhea  Abnormal uterine bleeding  Uterus, adenomyosis   Discharged Condition: stable  Hospital Course: Pt admitted 3/21 for LAVH/BSO, performed without complication, discharged Post Op Day 1, ambulating, voiding, tolerating regular diet, pain well controlled with po meds.  Consults: None  Significant Diagnostic Studies: labs: Cr stable, Hgb with expected change  Treatments: analgesia: Morphine and Percocet and surgery: LAVH/BSO  Discharge Exam: Blood pressure 94/60, pulse 63, temperature 97.7 F (36.5 C), temperature source Oral, resp. rate 18, height 5\' 4"  (1.626 m), weight 58.968 kg (130 lb), SpO2 100.00%. General appearance: alert and no distress Resp: clear to auscultation bilaterally Cardio: regular rate and rhythm GI: soft, non-tender; bowel sounds normal; no masses,  no organomegaly Extremities: extremities normal, atraumatic, no cyanosis or edema Incision/Wound:C/D/I  Disposition: home  Discharge Orders    Future Orders Please Complete By Expires   Diet - low sodium heart healthy      Increase activity slowly      Discharge instructions      Comments:   Call [phone] with questions or problems   May walk up steps      May shower / Bathe      Driving Restrictions      Comments:   While taking strong pain medicine   Sexual Activity Restrictions      Comments:   Pelvic rest - no douching, tampons or sex for 6 weeks   Lifting restrictions      Comments:   No greater than 25-35 lbs   Call MD for:      Scheduling Instructions:   Temperature greater than 100.5   Call MD for:  severe uncontrolled pain      Call MD for:   redness, tenderness, or signs of infection (pain, swelling, redness, odor or green/yellow discharge around incision site)        Medication List  As of 11/17/2011  8:17 AM   TAKE these medications         aspirin-acetaminophen-caffeine 250-250-65 MG per tablet   Commonly known as: EXCEDRIN MIGRAINE   Take 2 tablets by mouth as needed. Severe headache      ibuprofen 200 MG tablet   Commonly known as: ADVIL,MOTRIN   Take 400 mg by mouth as needed. headache      ibuprofen 800 MG tablet   Commonly known as: ADVIL,MOTRIN   Take 1 tablet (800 mg total) by mouth every 8 (eight) hours as needed for pain (mild pain).      oxyCODONE-acetaminophen 5-325 MG per tablet   Commonly known as: PERCOCET   Take 1-2 tablets by mouth every 6 (six) hours as needed (moderate to severe pain (when tolerating fluids)).      topiramate 50 MG tablet   Commonly known as: TOPAMAX   Take 75 mg by mouth 2 (two) times daily.           Follow-up Information    Follow up with BOVARD,Johnatan Baskette, MD. Schedule an appointment as soon as possible for a visit in 2 weeks.   Contact information:   [address] [facility] [address] [place] [address] [phone]          Signed: Sherron Monday 11/17/2011, 8:17 AM

## 2011-11-17 NOTE — Progress Notes (Signed)
Pt discharged home with husband.. Condition stable.. No equipment.. Ambulated to car with E. Pinion, NT. 

## 2011-11-17 NOTE — Progress Notes (Signed)
1 Day Post-Op Procedure(s) (LRB): LAPAROSCOPIC ASSISTED VAGINAL HYSTERECTOMY (N/A) SALPINGO OOPHERECTOMY (Bilateral)  Subjective: Patient reports nausea, incisional pain and tolerating PO.  Pain controlled, ambulating  Objective: I have reviewed patient's vital signs, intake and output, medications and labs.  General: alert and no distress Resp: clear to auscultation bilaterally Cardio: regular rate and rhythm GI: soft, non-tender; bowel sounds normal; no masses,  no organomegaly Extremities: extremities normal, atraumatic, no cyanosis or edema Incision C/D/I  Assessment: s/p Procedure(s) (LRB): LAPAROSCOPIC ASSISTED VAGINAL HYSTERECTOMY (N/A) SALPINGO OOPHERECTOMY (Bilateral): stable, progressing well and tolerating diet  Plan: Encourage ambulation Advance to PO medication Discontinue IV fluids Discharge homed/c PCA, d/c foley; f/u 2 weeks  LOS: 1 day    BOVARD,Kaveh Kissinger 11/17/2011, 8:10 AM

## 2011-11-20 ENCOUNTER — Encounter (HOSPITAL_COMMUNITY): Payer: Self-pay | Admitting: Obstetrics and Gynecology

## 2017-08-28 HISTORY — PX: REDUCTION MAMMAPLASTY: SUR839

## 2018-06-11 ENCOUNTER — Other Ambulatory Visit: Payer: Self-pay | Admitting: Plastic Surgery

## 2019-05-08 ENCOUNTER — Ambulatory Visit
Admission: EM | Admit: 2019-05-08 | Discharge: 2019-05-08 | Disposition: A | Discharge status: Home / Self Care | Payer: BC Managed Care – PPO | Attending: Emergency Medicine | Admitting: Emergency Medicine

## 2019-05-08 ENCOUNTER — Other Ambulatory Visit: Payer: Self-pay

## 2019-05-08 ENCOUNTER — Ambulatory Visit (INDEPENDENT_AMBULATORY_CARE_PROVIDER_SITE_OTHER): Payer: BC Managed Care – PPO

## 2019-05-08 DIAGNOSIS — S99921A Unspecified injury of right foot, initial encounter: Secondary | ICD-10-CM | POA: Diagnosis not present

## 2019-05-08 DIAGNOSIS — S92524A Nondisplaced fracture of medial phalanx of right lesser toe(s), initial encounter for closed fracture: Secondary | ICD-10-CM | POA: Diagnosis not present

## 2019-05-08 NOTE — ED Provider Notes (Signed)
[place]   [id number] 05/08/19 Arrival Time: 8676  CC: Toe PAIN  SUBJECTIVE: History from: patient. [name] is a 43 y.o. female complains of right second toe pain that began 10 days ago.  Symptoms began after she stubbed her toe on a table. Localizes the pain to the right second toe.  Describes the pain as intermittent and achy in character.  Has tried OTC medications with relief.  Symptoms are made worse to the touch.  Denies similar symptoms in the past. Complains of associated swelling and bruising. Denies fever, chills, erythema, weakness, numbness and tingling.    ROS: As per HPI.  All other pertinent ROS negative.     Past Medical History:  Diagnosis Date   Abnormal uterine bleeding 11/16/2011   Dysmenorrhea 11/16/2011   Headache(784.0)    migraines   S/P laparoscopic assisted vaginal hysterectomy (LAVH) 11/16/2011   Uterus, adenomyosis 11/16/2011   Past Surgical History:  Procedure Laterality Date   ABDOMINAL HYSTERECTOMY     CESAREAN SECTION  1995   ENDOMETRIAL ABLATION     LAPAROSCOPIC ASSISTED VAGINAL HYSTERECTOMY  11/16/2011   Procedure: LAPAROSCOPIC ASSISTED VAGINAL HYSTERECTOMY;  Surgeon: Thornell Sartorius, MD;  Location: [facility];  Service: Gynecology;  Laterality: N/A;   LIPOMA EXCISION  2008   SALPINGOOPHORECTOMY  11/16/2011   Procedure: SALPINGO OOPHERECTOMY;  Surgeon: Thornell Sartorius, MD;  Location: [facility];  Service: Gynecology;  Laterality: Bilateral;   TUBAL LIGATION     No Known Allergies No current facility-administered medications on file prior to encounter.    Current Outpatient Medications on File Prior to Encounter  Medication Sig Dispense Refill   aspirin-acetaminophen-caffeine (EXCEDRIN MIGRAINE) 250-250-65 MG per tablet Take 2 tablets by mouth as needed. Severe headache     ibuprofen (ADVIL,MOTRIN) 200 MG tablet Take 400 mg by mouth as needed. headache     topiramate (TOPAMAX) 50 MG tablet Take 75 mg by mouth 2 (two) times daily.      Social History   Socioeconomic History   Marital status: Married    Spouse name: Not on file   Number of children: Not on file   Years of education: Not on file   Highest education level: Not on file  Occupational History   Not on file  Social Needs   Financial resource strain: Not on file   Food insecurity    Worry: Not on file    Inability: Not on file   Transportation needs    Medical: Not on file    Non-medical: Not on file  Tobacco Use   Smoking status: Never Smoker  Substance and Sexual Activity   Alcohol use: Yes    Comment: rarely   Drug use: No   Sexual activity: Not on file  Lifestyle   Physical activity    Days per week: Not on file    Minutes per session: Not on file   Stress: Not on file  Relationships   Social connections    Talks on phone: Not on file    Gets together: Not on file    Attends religious service: Not on file    Active member of club or organization: Not on file    Attends meetings of clubs or organizations: Not on file    Relationship status: Not on file   Intimate partner violence    Fear of current or ex partner: Not on file    Emotionally abused: Not on file    Physically abused: Not on file  Forced sexual activity: Not on file  Other Topics Concern   Not on file  Social History Narrative   Not on file   History reviewed. No pertinent family history.  OBJECTIVE:  Vitals:   05/08/19 1245  BP: 120/85  Pulse: 74  Resp: 20  Temp: 98.2 F (36.8 C)  SpO2: 98%    General appearance: ALERT; in no acute distress.  Head: NCAT Lungs: Normal respiratory effort CV: Doraslis pedis pulses 2+ RT foot. Cap refill < 2 seconds Musculoskeletal: RT foot Inspection: Mild ecchymosis distal second toe Palpation: TTP over second distal phalanx ROM: LROM about the second toe Strength: 5/5 dorsiflexion, 5/5 plantar flexion Skin: warm and dry Neurologic: Ambulates with minimal difficulty; Sensation intact about the  lower extremities Psychological: alert and cooperative; normal mood and affect  DIAGNOSTIC STUDIES:  Dg Foot Complete Right  Result Date: 05/08/2019 CLINICAL DATA:  Blunt trauma to right second toe 10 days ago. EXAM: RIGHT FOOT COMPLETE - 3+ VIEW COMPARISON:  None. FINDINGS: The lateral view demonstrates a possible subtle chip fracture along the dorsal aspect of the second middle phalanx at the DIP joint. No definite fracture identified on the AP or oblique views. Remainder the exam is unremarkable. IMPRESSION: Possible small chip fracture along the dorsal aspect of the second middle phalanx at the DIP joint. Electronically Signed   By: Marin Olp M.D.   On: 05/08/2019 13:20    X-rays positive for second middle phalanx fracture.  No soft tissue swelling.    I have reviewed the x-rays myself and the radiologist interpretation. I am in agreement with the radiologist interpretation.     ASSESSMENT & PLAN:  1. Injury of toe on right foot, initial encounter   2. Closed nondisplaced fracture of middle phalanx of lesser toe of right foot, initial encounter     X-rays concerning for possible healed second toe fracture.  Pending radiologist interpretation Continue conservative management of rest, ice, and elevation Alternate ibuprofen and tylenol as needed for pain relief (may cause abdominal discomfort, ulcers, and GI bleeds avoid taking with other NSAIDs) Follow up with orthopedist for further evaluation and management Return or go to the ER if you have any new or worsening symptoms (fever, chills, chest pain, increased redness, swelling, bruising, etc...)    Reviewed expectations re: course of current medical issues. Questions answered. Outlined signs and symptoms indicating need for more acute intervention. Patient verbalized understanding. After Visit Summary given.    Lestine Box, PA-C 05/08/19 1329

## 2019-05-08 NOTE — Discharge Instructions (Signed)
X-rays concerning for possible healed second toe fracture.  Pending radiologist interpretation Continue conservative management of rest, ice, and elevation Alternate ibuprofen and tylenol as needed for pain relief (may cause abdominal discomfort, ulcers, and GI bleeds avoid taking with other NSAIDs) Follow up with orthopedist for further evaluation and management Return or go to the ER if you have any new or worsening symptoms (fever, chills, chest pain, increased redness, swelling, bruising, etc...)

## 2019-05-08 NOTE — ED Triage Notes (Signed)
Pt stubbed toe on table at home on the 31st right second toe is bruised and deformed

## 2020-03-23 ENCOUNTER — Ambulatory Visit: Payer: BC Managed Care – PPO | Admitting: Family Medicine

## 2020-03-23 ENCOUNTER — Encounter: Payer: Self-pay | Admitting: Family Medicine

## 2020-03-23 ENCOUNTER — Other Ambulatory Visit: Payer: Self-pay

## 2020-03-23 VITALS — BP 144/82 | HR 69 | Temp 97.2°F | Resp 16 | Ht 64.0 in | Wt 146.4 lb

## 2020-03-23 DIAGNOSIS — Z0001 Encounter for general adult medical examination with abnormal findings: Secondary | ICD-10-CM | POA: Insufficient documentation

## 2020-03-23 DIAGNOSIS — Z780 Asymptomatic menopausal state: Secondary | ICD-10-CM | POA: Insufficient documentation

## 2020-03-23 DIAGNOSIS — L309 Dermatitis, unspecified: Secondary | ICD-10-CM

## 2020-03-23 DIAGNOSIS — I10 Essential (primary) hypertension: Secondary | ICD-10-CM | POA: Diagnosis not present

## 2020-03-23 DIAGNOSIS — E663 Overweight: Secondary | ICD-10-CM | POA: Diagnosis not present

## 2020-03-23 DIAGNOSIS — Z833 Family history of diabetes mellitus: Secondary | ICD-10-CM | POA: Diagnosis not present

## 2020-03-23 DIAGNOSIS — Z6825 Body mass index (BMI) 25.0-25.9, adult: Secondary | ICD-10-CM

## 2020-03-23 DIAGNOSIS — G43909 Migraine, unspecified, not intractable, without status migrainosus: Secondary | ICD-10-CM | POA: Insufficient documentation

## 2020-03-23 DIAGNOSIS — M539 Dorsopathy, unspecified: Secondary | ICD-10-CM | POA: Insufficient documentation

## 2020-03-23 HISTORY — DX: Encounter for general adult medical examination with abnormal findings: Z00.01

## 2020-03-23 HISTORY — DX: Dorsopathy, unspecified: M53.9

## 2020-03-23 MED ORDER — TOPIRAMATE 100 MG PO TABS: 100.0000 mg | ORAL_TABLET | Freq: Two times a day (BID) | ORAL | 0 refills | Status: DC

## 2020-03-23 MED ORDER — TRIAMCINOLONE ACETONIDE 0.025 % EX OINT: 1.0000 "application " | TOPICAL_OINTMENT | Freq: Every day | CUTANEOUS | 0 refills | Status: DC

## 2020-03-23 NOTE — Progress Notes (Signed)
Subjective:  Patient ID: [name], female    DOB: 1975-11-08  Age: 44 y.o. MRN: [id number]  CC:  Chief Complaint  Patient presents with  . New Patient (Initial Visit)    New pt former dr [name] pt pt has concerns about right hand having eczema brother has eczema so she is concerned       HPI  HPI  [name] is a 44 year old female patient who presents today to establish care.  Previous patient Dr. Luan Graham.  She has an eczema patch on her right hand but otherwise she does not have any other issues or concerns.  She has a history that is consistent with migraines and headaches on Topamax but reports that she still gets headaches daily.,  Dysmenorrhea and abnormal uterine bleeding did have hysterectomy.   She reports that she sleeps okay.  She denies having any trouble with eating chewing or appetite changes.  Sees a dentist regularly.  Denies having any issues or concerns or changes in bowel or bladder habits.  Denies having any blood or urine or stool.  Denies having any memory issues.  Denies have any falls or injury.  Eczema as stated above on her right hand.  Brother has the same thing.  Needs to get medication topical for this.  Has had Lasix twice her vision is okay however it continues to change.  She denies having any hearing trouble.  She has no chest pain, shortness of breath, cough, leg swelling, changes in heart rate sensation, dizziness.  She reports she still gets headaches daily.  Migraines are not as frequent.  Is on Topamax 100 mg twice daily.  Is willing to think about possible change of medication and referral to neurology for this.  Of note multiple people in her family had Covid.  She reports she had her vaccinations as has her immediate family.  She did lose her father-in-law to this right before the vaccines came out.  Today patient denies signs and symptoms of COVID 19 infection including fever, chills, cough, shortness of breath, and headache. Past  Medical, Surgical, Social History, Allergies, and Medications have been Reviewed.   Past Medical History:  Diagnosis Date  . Abnormal uterine bleeding 11/16/2011  . Dysmenorrhea 11/16/2011  . Headache(784.0)    migraines  . S/P laparoscopic assisted vaginal hysterectomy (LAVH) 11/16/2011  . Uterus, adenomyosis 11/16/2011    Current Meds  Medication Sig  . topiramate (TOPAMAX) 100 MG tablet Take 1 tablet (100 mg total) by mouth 2 (two) times daily.  . [DISCONTINUED] topiramate (TOPAMAX) 100 MG tablet Take 100 mg by mouth 2 (two) times daily.    ROS:  Review of Systems  HENT: Negative.   Eyes: Negative.   Respiratory: Negative.   Cardiovascular: Negative.   Gastrointestinal: Negative.   Genitourinary: Negative.   Musculoskeletal: Negative.   Skin: Positive for rash.  Neurological: Negative.   Endo/Heme/Allergies: Negative.   Psychiatric/Behavioral: Negative.   All other systems reviewed and are negative.    Objective:   Today's Vitals: BP (!) 144/82 (BP Location: Right Arm, Patient Position: Sitting, Cuff Size: Normal)   Pulse 69   Temp (!) 97.2 F (36.2 C) (Oral)   Resp 16   Ht 5' 4"  (1.626 m)   Wt 146 lb 6.4 oz (66.4 kg)   LMP 10/17/2011   SpO2 98%   BMI 25.13 kg/m  Vitals with BMI 03/23/2020 05/08/2019 11/17/2011  Height 5' 4"  - -  Weight 146 lbs 6 oz - -  BMI 75.17 - -  Systolic 001 749 97  Diastolic 82 85 65  Pulse 69 74 72     Physical Exam Vitals and nursing note reviewed.  Constitutional:      Appearance: Normal appearance. She is well-developed and well-groomed. She is obese.  HENT:     Head: Normocephalic and atraumatic.     Right Ear: External ear normal.     Left Ear: External ear normal.     Mouth/Throat:     Comments: Mask in place  Eyes:     General:        Right eye: No discharge.        Left eye: No discharge.     Conjunctiva/sclera: Conjunctivae normal.  Cardiovascular:     Rate and Rhythm: Normal rate and regular rhythm.     Pulses:  Normal pulses.     Heart sounds: Normal heart sounds.  Pulmonary:     Effort: Pulmonary effort is normal.     Breath sounds: Normal breath sounds.  Musculoskeletal:        General: Normal range of motion.     Cervical back: Normal range of motion and neck supple.  Skin:    General: Skin is warm.     Comments: Area of dry scaliness on posterior side of hand  Neurological:     General: No focal deficit present.     Mental Status: She is alert and oriented to person, place, and time.  Psychiatric:        Attention and Perception: Attention normal.        Mood and Affect: Mood normal.        Speech: Speech normal.        Behavior: Behavior normal. Behavior is cooperative.        Thought Content: Thought content normal.        Cognition and Memory: Cognition normal.        Judgment: Judgment normal.     Comments: Good communication, good eye contact     Assessment   1. Essential hypertension   2. Family history of diabetes mellitus in mother   6. Overweight with body mass index (BMI) of 25 to 25.9 in adult   4. Eczema, unspecified type   5. Migraine without status migrainosus, not intractable, unspecified migraine type     Tests ordered Orders Placed This Encounter  Procedures  . CBC with Differential/Platelet  . Comprehensive metabolic panel  . Hemoglobin A1c  . Lipid panel     Plan: Please see assessment and plan per problem list above.   Meds ordered this encounter  Medications  . triamcinolone (KENALOG) 0.025 % ointment    Sig: Apply 1 application topically daily.    Dispense:  30 g    Refill:  0    Order Specific Question:   Supervising Provider    Answer:   [name] [[id number]]  . topiramate (TOPAMAX) 100 MG tablet    Sig: Take 1 tablet (100 mg total) by mouth 2 (two) times daily.    Dispense:  180 tablet    Refill:  0    Order Specific Question:   Supervising Provider    Answer:   [name]    Patient to follow-up in  06/25/2020  Jade Mayo, NP

## 2020-03-23 NOTE — Assessment & Plan Note (Signed)
Takes Topamax 100 mg twice daily.  But reports that she still gets headaches daily.  And uses Goody powders for these.  She reports that she will think about possible change of medication in addition to referral to neurology to help identify any possible adjustments that can be made.

## 2020-03-23 NOTE — Assessment & Plan Note (Signed)
Deteriorated,  [name] is educated about the importance of exercise daily to help with weight management.A minumum of 30 minutes daily is recommended. Additionally, importance of healthy food choices with portion control discussed.  Wt Readings from Last 3 Encounters:  03/23/20 146 lb 6.4 oz (66.4 kg)  11/16/11 130 lb (59 kg)  11/08/11 130 lb (59 kg)

## 2020-03-23 NOTE — Assessment & Plan Note (Signed)
Blood pressure elevated today.  Will be getting updated labs to check kidney function in addition to bringing her back in a short few months to make sure that her blood pressure is stable.

## 2020-03-23 NOTE — Patient Instructions (Addendum)
I appreciate the opportunity to provide you with care for your health and wellness. Today we discussed: established care  Follow up: 3 months BP check  Fasting labs within the week at [facility] No referrals today  GREAT TO [place]!  Please continue to practice social distancing to keep you, your family, and our community safe.  If you must go out, please wear a mask and practice good handwashing.  It was a pleasure to see you and I look forward to continuing to work together on your health and well-being. Please do not hesitate to call the office if you need care or have questions about your care.  Have a wonderful day and week. With Gratitude, Cherly Beach, DNP, AGNP-BC

## 2020-03-23 NOTE — Assessment & Plan Note (Signed)
Family history with mother will get A1c check to make sure that she does not have any issues going on that she has put on some weight.  In addition to having baseline level.

## 2020-03-23 NOTE — Assessment & Plan Note (Signed)
Will do low-dose steroid cream to see if this helps with the patchy area.

## 2020-03-25 LAB — COMPREHENSIVE METABOLIC PANEL
ALT: 12 IU/L (ref 0–32)
AST: 10 IU/L (ref 0–40)
Albumin/Globulin Ratio: 2.8 — ABNORMAL HIGH (ref 1.2–2.2)
Albumin: 4.8 g/dL (ref 3.8–4.8)
Alkaline Phosphatase: 73 IU/L (ref 48–121)
BUN/Creatinine Ratio: 17 (ref 9–23)
BUN: 13 mg/dL (ref 6–24)
Bilirubin Total: 0.3 mg/dL (ref 0.0–1.2)
CO2: 21 mmol/L (ref 20–29)
Calcium: 9.3 mg/dL (ref 8.7–10.2)
Chloride: 108 mmol/L — ABNORMAL HIGH (ref 96–106)
Creatinine, Ser: 0.75 mg/dL (ref 0.57–1.00)
GFR calc Af Amer: 112 mL/min/{1.73_m2} (ref >59)
GFR calc non Af Amer: 97 mL/min/{1.73_m2} (ref >59)
Globulin, Total: 1.7 g/dL (ref 1.5–4.5)
Glucose: 93 mg/dL (ref 65–99)
Potassium: 3.9 mmol/L (ref 3.5–5.2)
Sodium: 142 mmol/L (ref 134–144)
Total Protein: 6.5 g/dL (ref 6.0–8.5)

## 2020-03-25 LAB — CBC WITH DIFFERENTIAL/PLATELET
Basophils Absolute: 0 10*3/uL (ref 0.0–0.2)
Basos: 1 %
EOS (ABSOLUTE): 0.2 10*3/uL (ref 0.0–0.4)
Eos: 3 %
Hematocrit: 38.9 % (ref 34.0–46.6)
Hemoglobin: 13.1 g/dL (ref 11.1–15.9)
Immature Grans (Abs): 0 10*3/uL (ref 0.0–0.1)
Immature Granulocytes: 0 %
Lymphocytes Absolute: 1.7 10*3/uL (ref 0.7–3.1)
Lymphs: 27 %
MCH: 29.8 pg (ref 26.6–33.0)
MCHC: 33.7 g/dL (ref 31.5–35.7)
MCV: 88 fL (ref 79–97)
Monocytes Absolute: 0.3 10*3/uL (ref 0.1–0.9)
Monocytes: 5 %
Neutrophils Absolute: 4.1 10*3/uL (ref 1.4–7.0)
Neutrophils: 64 %
Platelets: 196 10*3/uL (ref 150–450)
RBC: 4.4 x10E6/uL (ref 3.77–5.28)
RDW: 12.2 % (ref 11.7–15.4)
WBC: 6.3 10*3/uL (ref 3.4–10.8)

## 2020-03-25 LAB — HEMOGLOBIN A1C
Est. average glucose Bld gHb Est-mCnc: 91 mg/dL
Hgb A1c MFr Bld: 4.8 % (ref 4.8–5.6)

## 2020-03-25 LAB — LIPID PANEL
Chol/HDL Ratio: 3 ratio (ref 0.0–4.4)
Cholesterol, Total: 147 mg/dL (ref 100–199)
HDL: 49 mg/dL (ref >39)
LDL Chol Calc (NIH): 84 mg/dL (ref 0–99)
Triglycerides: 70 mg/dL (ref 0–149)
VLDL Cholesterol Cal: 14 mg/dL (ref 5–40)

## 2020-04-21 ENCOUNTER — Telehealth: Payer: Self-pay

## 2020-04-21 NOTE — Telephone Encounter (Signed)
We had discussed this in the office.  I encouraged her to avoid donation of plasma for a while. I do not have the actual paperwork however I think possible referral to hematology might be best for work-up. And/or identification of anything going on.

## 2020-04-21 NOTE — Telephone Encounter (Signed)
Pt had lab work done from donating plasma and the protein came back abonormal (Gamma)

## 2020-04-22 ENCOUNTER — Encounter: Payer: Self-pay | Admitting: Family Medicine

## 2020-04-22 ENCOUNTER — Other Ambulatory Visit: Payer: Self-pay

## 2020-04-22 ENCOUNTER — Telehealth: Payer: BC Managed Care – PPO | Admitting: Family Medicine

## 2020-04-22 VITALS — BP 144/82 | Ht 64.0 in | Wt 146.0 lb

## 2020-04-22 DIAGNOSIS — R899 Unspecified abnormal finding in specimens from other organs, systems and tissues: Secondary | ICD-10-CM

## 2020-04-22 NOTE — Progress Notes (Signed)
Virtual Visit via Telephone Note   This visit type was conducted due to national recommendations for restrictions regarding the COVID-19 Pandemic (e.g. social distancing) in an effort to limit this patient's exposure and mitigate transmission in our community.  Due to her co-morbid illnesses, this patient is at least at moderate risk for complications without adequate follow up.  This format is felt to be most appropriate for this patient at this time.  The patient did not have access to video technology/had technical difficulties with video requiring transitioning to audio format only (telephone).  All issues noted in this document were discussed and addressed.  No physical exam could be performed with this format.    Evaluation Performed:  Follow-up visit  Date:  04/22/2020   ID:  [name], DOB Sep 13, 1975, MRN [id number]  Patient Location: Home Provider Location: Office/Clinic  Location of Patient: Home Location of Provider: Telehealth Consent was obtain for visit to be over via telehealth. I verified that I am speaking with the correct person using two identifiers.  PCP:  Perlie Mayo, NP   Chief Complaint: Lab review  History of Present Illness:    [name] is a 44 y.o. female with with history of back problems, migraines, eczema, hypertension, overweight.  She reports today after having plasma drawn and being told that she had some changes in her protein/albumin levels.  I am not familiar with these particular enzymes and how they function in the body.  She reports that she needs some my to sign off to report that she can continue to give plasma. I have suggested possible referral to hematology be an option to discuss they might know a better idea about this.  She is open to this.  She denies having any other issues or concerns today.  The patient does not have symptoms concerning for COVID-19 infection (fever, chills, cough, or new shortness of breath).   Past  Medical, Surgical, Social History, Allergies, and Medications have been Reviewed.  Past Medical History:  Diagnosis Date  . Abnormal uterine bleeding 11/16/2011  . Back problem 03/23/2020  . 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 Dysmenorrhea 11/16/2011  . Headache(784.0)    migraines  . 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 S/P laparoscopic assisted vaginal hysterectomy (LAVH) 11/16/2011  . Single liveborn, born in hospital, delivered by cesarean delivery 08/28/1994  . Uterus, adenomyosis 11/16/2011   Past Surgical History:  Procedure Laterality Date  . ABDOMINAL HYSTERECTOMY    . CESAREAN SECTION  1995  . ENDOMETRIAL ABLATION    . LAPAROSCOPIC ASSISTED VAGINAL HYSTERECTOMY  11/16/2011   Procedure: LAPAROSCOPIC ASSISTED VAGINAL HYSTERECTOMY;  Surgeon: Thornell Sartorius, MD;  Location: [facility];  Service: Gynecology;  Laterality: N/A;  . LIPOMA EXCISION  2008  . SALPINGOOPHORECTOMY  11/16/2011   Procedure: SALPINGO OOPHERECTOMY;  Surgeon: Thornell Sartorius, MD;  Location: [facility];  Service: Gynecology;  Laterality: Bilateral;  . TUBAL LIGATION       Current Meds  Medication Sig  . aspirin-acetaminophen-caffeine (EXCEDRIN MIGRAINE) 086-578-46 MG per tablet Take 2 tablets by mouth as needed. Severe headache   . ibuprofen (ADVIL,MOTRIN) 200 MG tablet Take 400 mg by mouth as needed. headache   . topiramate (TOPAMAX) 100 MG tablet Take 1 tablet (100 mg total) by mouth 2 (two) times daily.  Marland Kitchen triamcinolone (KENALOG) 0.025 % ointment Apply 1 application topically daily.     Allergies:   Patient has no known allergies.   ROS:   Please see  the history of present illness.    All other systems reviewed and are negative.   Labs/Other Tests and Data Reviewed:    Recent Labs: 03/24/2020: ALT 12; BUN 13; Creatinine, Ser 0.75; Hemoglobin 13.1; Platelets 196; Potassium 3.9; Sodium 142   Recent Lipid Panel Lab Results  Component Value Date/Time   CHOL 147 03/24/2020 08:21 AM   TRIG 70 03/24/2020 08:21 AM   HDL 49  03/24/2020 08:21 AM   CHOLHDL 3.0 03/24/2020 08:21 AM   LDLCALC 84 03/24/2020 08:21 AM    Wt Readings from Last 3 Encounters:  04/22/20 146 lb (66.2 kg)  03/23/20 146 lb 6.4 oz (66.4 kg)  11/16/11 130 lb (59 kg)     Objective:    Vital Signs:  BP (!) 144/82   Ht 5' 4"  (1.626 m)   Wt 146 lb (66.2 kg)   LMP 10/17/2011   BMI 25.06 kg/m    VITAL SIGNS:  reviewed GEN:  alert and oriented RESPIRATORY:  No shortness of breath noted in conversation PSYCH:  normal affect and mood '  ASSESSMENT & PLAN:    1. Abnormal laboratory test  - Ambulatory referral to Hematology    Time:   Today, I have spent 7 minutes with the patient with telehealth technology discussing the above problems.     Medication Adjustments/Labs and Tests Ordered: Current medicines are reviewed at length with the patient today.  Concerns regarding medicines are outlined above.   Tests Ordered: No orders of the defined types were placed in this encounter.   Medication Changes: No orders of the defined types were placed in this encounter.   Disposition:  Follow up as scheduled Signed, Perlie Mayo, NP  04/22/2020 11:51 AM     [place]

## 2020-04-22 NOTE — Telephone Encounter (Signed)
Left a message asking for a return call

## 2020-04-22 NOTE — Telephone Encounter (Signed)
Patient had virtual visit with provider today

## 2020-04-23 ENCOUNTER — Encounter: Payer: Self-pay | Admitting: Family Medicine

## 2020-04-23 DIAGNOSIS — R899 Unspecified abnormal finding in specimens from other organs, systems and tissues: Secondary | ICD-10-CM | POA: Insufficient documentation

## 2020-04-23 NOTE — Assessment & Plan Note (Addendum)
Abnormal labs secondary to having plasma drawn.  Referral to hematology as I am not familiar with these types of enzymes or components and how to discuss this with her.  She reports that she will need a note from the doctor to clear her to continue to have plasma drawn.  I do not feel comfortable doing this as I am not familiar with the situation so she is okay with me referring her to hematology.

## 2020-05-19 ENCOUNTER — Encounter (HOSPITAL_COMMUNITY): Payer: Self-pay | Admitting: Surgery

## 2020-05-19 ENCOUNTER — Other Ambulatory Visit: Payer: Self-pay

## 2020-05-20 ENCOUNTER — Encounter (HOSPITAL_COMMUNITY): Payer: Self-pay | Admitting: Hematology

## 2020-05-20 ENCOUNTER — Inpatient Hospital Stay (HOSPITAL_COMMUNITY): Payer: BC Managed Care – PPO

## 2020-05-20 ENCOUNTER — Inpatient Hospital Stay (HOSPITAL_COMMUNITY): Payer: BC Managed Care – PPO | Attending: Hematology | Admitting: Hematology

## 2020-05-20 VITALS — BP 126/82 | HR 70 | Temp 97.2°F | Resp 18 | Wt 145.6 lb

## 2020-05-20 DIAGNOSIS — Z8051 Family history of malignant neoplasm of kidney: Secondary | ICD-10-CM | POA: Diagnosis not present

## 2020-05-20 DIAGNOSIS — D801 Nonfamilial hypogammaglobulinemia: Secondary | ICD-10-CM | POA: Diagnosis present

## 2020-05-20 DIAGNOSIS — N946 Dysmenorrhea, unspecified: Secondary | ICD-10-CM | POA: Diagnosis not present

## 2020-05-20 NOTE — Progress Notes (Signed)
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   Today she reports that she donates plasma every 2 weeks for over 3 years and has her protein levels checked; her gamma levels have been consistently low since 08/2019. She was sick with COVID in December 2020, confirmed with antibodies after she quarantined, but denies recurrent infections prior to that; she denies F/C, night sweats, unexplained weight loss, adenopathy or rashes.  She denies any protein abnormalities in her family. Her father had renal cancer; her PGF deceased from some kind of cancer. 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 She is a non-smoker.   MEDICAL HISTORY:  Past Medical History:  Diagnosis Date  . Abnormal uterine bleeding 11/16/2011  . Back problem 03/23/2020  . 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 Dysmenorrhea 11/16/2011  . Headache(784.0)    migraines  . 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 S/P laparoscopic assisted vaginal hysterectomy (LAVH) 11/16/2011  . Single liveborn, born in hospital, delivered by cesarean delivery 08/28/1994  . Uterus, adenomyosis 11/16/2011    SURGICAL HISTORY: Past Surgical History:  Procedure Laterality Date  . ABDOMINAL HYSTERECTOMY    . CESAREAN SECTION  1995  . ENDOMETRIAL ABLATION    . LAPAROSCOPIC ASSISTED VAGINAL HYSTERECTOMY  11/16/2011   Procedure: LAPAROSCOPIC ASSISTED VAGINAL HYSTERECTOMY;  Surgeon: Thornell Sartorius, MD;  Location: [facility];  Service: Gynecology;  Laterality: N/A;  . LIPOMA EXCISION  2008  . SALPINGOOPHORECTOMY  11/16/2011   Procedure: SALPINGO OOPHERECTOMY;  Surgeon: Thornell Sartorius,  MD;  Location: [facility];  Service: Gynecology;  Laterality: Bilateral;  . TUBAL LIGATION      SOCIAL HISTORY: Social History   Socioeconomic History  . Marital status: Married    Spouse name: Abe People   . Number of children: 2  . Years of education: Not on file  . Highest education level: High school graduate  Occupational History    Employer: [employer]  Tobacco Use  . Smoking status: Never Smoker  . Smokeless tobacco: Never Used  Substance and Sexual Activity  . Alcohol use: Yes    Comment: rarely  . Drug use: No  . Sexual activity: Yes    Birth control/protection: Surgical  Other Topics Concern  . 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 Difficulty of Paying Living Expenses: Not hard at all  Food Insecurity: No Food Insecurity  . Worried About [employer] of  Food in the Last Year: Never true  . Ran Out of Food in the Last Year: Never true  Transportation Needs: No Transportation Needs  . Lack of Transportation (Medical): No  . Lack of Transportation (Non-Medical): No  Physical Activity: Insufficiently Active  . Days of Exercise per Week: 3 days  . Minutes of Exercise per Session: 30 min  Stress: No Stress Concern Present  . Feeling of Stress : Not at all  Social Connections: Moderately Isolated  . Frequency of Communication with Friends and Family: More than three times a week  . Frequency of Social Gatherings with Friends and Family: More than  three times a week  . Attends Religious Services: Never  . Active Member of Clubs or Organizations: No  . Attends Archivist Meetings: Never  . Marital Status: Married  Human resources officer Violence: Not At Risk  . Fear of Current or Ex-Partner: No  . Emotionally Abused: No  . Physically Abused: No  . Sexually Abused: No    FAMILY HISTORY: Family History  Problem Relation Age of Onset  . Diabetes Mother   . Hypertension Mother   . Hyperlipidemia Mother   . Cancer Father        kideny cancer-removed   . Stroke Father   . Heart disease Maternal Grandmother   . Heart disease Maternal Grandfather   . Heart disease Paternal Grandmother   . Heart disease Paternal Grandfather   . Cancer Paternal Grandfather   . Headache Sister   . Headache Brother   . Headache Sister     ALLERGIES:  has No Known Allergies.  MEDICATIONS:  Current Outpatient Medications  Medication Sig Dispense Refill  . aspirin-acetaminophen-caffeine (EXCEDRIN MIGRAINE) 250-250-65 MG per tablet Take 2 tablets by mouth as needed. Severe headache     . Aspirin-Acetaminophen-Caffeine (GOODYS EXTRA STRENGTH PO) Take 1 packet by mouth as needed.    . topiramate (TOPAMAX) 100 MG tablet Take 1 tablet (100 mg total) by mouth 2 (two) times daily. 180 tablet 0  . triamcinolone (KENALOG) 0.025 % ointment Apply 1 application topically daily. 30 g 0   No current facility-administered medications for this visit.    REVIEW OF SYSTEMS:   Review of Systems  Constitutional: Negative for appetite change, chills, diaphoresis, fatigue, fever and unexpected weight change.  Skin: Negative for rash.  Neurological: Positive for headaches (on Topamax).  Hematological: Negative for adenopathy.     PHYSICAL EXAMINATION: ECOG PERFORMANCE STATUS: 0 - Asymptomatic  Vitals:   05/20/20 1334  BP: 126/82  Pulse: 70  Resp: 18  Temp: (!) 97.2 F (36.2 C)  SpO2: 100%   Filed Weights   05/20/20 1334  Weight: 145 lb 9.6 oz  (66 kg)   Physical Exam Vitals reviewed.  Constitutional:      Appearance: Normal appearance.  Cardiovascular:     Rate and Rhythm: Normal rate and regular rhythm.     Pulses: Normal pulses.     Heart sounds: Normal heart sounds.  Pulmonary:     Effort: Pulmonary effort is normal.     Breath sounds: Normal breath sounds.  Abdominal:     Palpations: Abdomen is soft. There is no hepatomegaly, splenomegaly or mass.     Tenderness: There is no abdominal tenderness.     Hernia: No hernia is present.  Musculoskeletal:     Right lower leg: No edema.     Left lower leg: No edema.  Lymphadenopathy:     Cervical: No cervical adenopathy.  Upper Body:     Right upper body: No supraclavicular, axillary or pectoral adenopathy.     Left upper body: No supraclavicular, axillary or pectoral adenopathy.     Lower Body: No right inguinal adenopathy. No left inguinal adenopathy.  Neurological:     General: No focal deficit present.     Mental Status: She is alert and oriented to person, place, and time.  Psychiatric:        Mood and Affect: Mood normal.        Behavior: Behavior normal.      LABORATORY DATA:  I have reviewed the data as listed Recent Results (from the past 2160 hour(s))  CBC with Differential/Platelet     Status: None   Collection Time: 03/24/20  8:21 AM  Result Value Ref Range   WBC 6.3 3.4 - 10.8 x10E3/uL   RBC 4.40 3.77 - 5.28 x10E6/uL   Hemoglobin 13.1 11.1 - 15.9 g/dL   Hematocrit 38.9 34.0 - 46.6 %   MCV 88 79 - 97 fL   MCH 29.8 26.6 - 33.0 pg   MCHC 33.7 31 - 35 g/dL   RDW 12.2 11.7 - 15.4 %   Platelets 196 150 - 450 x10E3/uL   Neutrophils 64 Not Estab. %   Lymphs 27 Not Estab. %   Monocytes 5 Not Estab. %   Eos 3 Not Estab. %   Basos 1 Not Estab. %   Neutrophils Absolute 4.1 1 - 7 x10E3/uL   Lymphocytes Absolute 1.7 0 - 3 x10E3/uL   Monocytes Absolute 0.3 0 - 0 x10E3/uL   EOS (ABSOLUTE) 0.2 0.0 - 0.4 x10E3/uL   Basophils Absolute 0.0 0 - 0 x10E3/uL     Immature Granulocytes 0 Not Estab. %   Immature Grans (Abs) 0.0 0.0 - 0.1 x10E3/uL  Comprehensive metabolic panel     Status: Abnormal   Collection Time: 03/24/20  8:21 AM  Result Value Ref Range   Glucose 93 65 - 99 mg/dL   BUN 13 6 - 24 mg/dL   Creatinine, Ser 0.75 0.57 - 1.00 mg/dL   GFR calc non Af Amer 97 >59 mL/min/1.73   GFR calc Af Amer 112 >59 mL/min/1.73    Comment: **[facility] currently reports eGFR in compliance with the current**   recommendations of the [employer]. [facility] will   update reporting as new guidelines are published from the NKF-ASN   Task force.    BUN/Creatinine Ratio 17 9 - 23   Sodium 142 134 - 144 mmol/L   Potassium 3.9 3.5 - 5.2 mmol/L   Chloride 108 (H) 96 - 106 mmol/L   CO2 21 20 - 29 mmol/L   Calcium 9.3 8.7 - 10.2 mg/dL   Total Protein 6.5 6.0 - 8.5 g/dL   Albumin 4.8 3.8 - 4.8 g/dL   Globulin, Total 1.7 1.5 - 4.5 g/dL   Albumin/Globulin Ratio 2.8 (H) 1.2 - 2.2   Bilirubin Total 0.3 0.0 - 1.2 mg/dL   Alkaline Phosphatase 73 48 - 121 IU/L   AST 10 0 - 40 IU/L   ALT 12 0 - 32 IU/L  Hemoglobin A1c     Status: None   Collection Time: 03/24/20  8:21 AM  Result Value Ref Range   Hgb A1c MFr Bld 4.8 4.8 - 5.6 %    Comment:          Prediabetes: 5.7 - 6.4          Diabetes: >6.4  Glycemic control for adults with diabetes: <7.0    Est. average glucose Bld gHb Est-mCnc 91 mg/dL  Lipid panel     Status: None   Collection Time: 03/24/20  8:21 AM  Result Value Ref Range   Cholesterol, Total 147 100 - 199 mg/dL   Triglycerides 70 0 - 149 mg/dL   HDL 49 >39 mg/dL   VLDL Cholesterol Cal 14 5 - 40 mg/dL   LDL Chol Calc (NIH) 84 0 - 99 mg/dL   Chol/HDL Ratio 3.0 0.0 - 4.4 ratio    Comment:                                   T. Chol/HDL Ratio                                             Men  Women                               1/2 Avg.Risk  3.4    3.3                                   Avg.Risk  5.0    4.4                                 2X Avg.Risk  9.6    7.1                                3X Avg.Risk 23.4   11.0     RADIOGRAPHIC STUDIES: I have personally reviewed the radiological images as listed and agreed with the findings in the report.  ASSESSMENT:  1.  Abnormal serum protein electrophoresis: -She donates plasma twice weekly for the last 3 years. -[facility] has run serial protein electrophoresis with all of them showing slight decrease spike in the gamma region. -She denies any recurrent infections.  She had Covid infection in December 2020. -No fevers, night sweats or weight loss.  No new onset bone pains.  2.  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  Non-smoker. -Father had kidney cancer and paternal grandfather died of cancer, type unknown to the patient.    PLAN:  1.  Abnormal protein electrophoresis: -I have reviewed serum protein electrophoresis reports from [facility]. -Recent labs on 03/16/2020 showed albumin 4.8, total protein 6.5.  Albumin/globulin ratio was slightly elevated.  CBC was normal. -We will check serum protein electrophoresis, immunofixation. -We will also check serum quantitative immunoglobulins. -We will arrange for a follow-up phone visit.   All questions were answered. The patient knows to call the clinic with any problems, questions or concerns.   Derek Jack, MD 05/20/20 2:08 PM  [place] [phone]   I, Milinda Antis, am acting as a scribe for Dr. Sanda Linger.  I, Derek Jack MD, have reviewed the above documentation for accuracy and completeness, and I agree with the above.

## 2020-05-20 NOTE — Patient Instructions (Signed)
[place] at [facility] Discharge Instructions  You were seen today by Dr. Delton Coombes.  He discussed the events that led you to our office.  We will check some additional labs today and will do a phone visit in 1 week.  If nothing at that time then we can release you to begin donating again.     Thank you for choosing [place] at [facility] to provide your oncology and hematology care.  To afford each patient quality time with our provider, please arrive at least 15 minutes before your scheduled appointment time.   If you have a lab appointment with the [facility] please come in thru the Main Entrance and check in at the main information desk.  You need to re-schedule your appointment should you arrive 10 or more minutes late.  We strive to give you quality time with our providers, and arriving late affects you and other patients whose appointments are after yours.  Also, if you no show three or more times for appointments you may be dismissed from the clinic at the providers discretion.     Again, thank you for choosing [facility].  Our hope is that these requests will decrease the amount of time that you wait before being seen by our physicians.       _____________________________________________________________  Should you have questions after your visit to [facility], please contact our office at [phone] and follow the prompts.  Our office hours are 8:00 a.m. and 4:30 p.m. Monday - Friday.  Please note that voicemails left after 4:00 p.m. may not be returned until the following business day.  We are closed weekends and major holidays.  You do have access to a nurse 24-7, just call the main number to the clinic [phone] and do not press any options, hold on the line and a nurse will answer the phone.    For prescription refill requests, have your pharmacy contact our office and allow 72 hours.    Due to  Covid, you will need to wear a mask upon entering the hospital. If you do not have a mask, a mask will be given to you at the Main Entrance upon arrival. For doctor visits, patients may have 1 support person age 72 or older with them. For treatment visits, patients can not have anyone with them due to social distancing guidelines and our immunocompromised population.

## 2020-05-21 LAB — PROTEIN ELECTROPHORESIS, SERUM
A/G Ratio: 1.8 — ABNORMAL HIGH (ref 0.7–1.7)
Albumin ELP: 4.4 g/dL (ref 2.9–4.4)
Alpha-1-Globulin: 0.2 g/dL (ref 0.0–0.4)
Alpha-2-Globulin: 0.7 g/dL (ref 0.4–1.0)
Beta Globulin: 0.9 g/dL (ref 0.7–1.3)
Gamma Globulin: 0.7 g/dL (ref 0.4–1.8)
Globulin, Total: 2.5 g/dL (ref 2.2–3.9)
Total Protein ELP: 6.9 g/dL (ref 6.0–8.5)

## 2020-05-21 LAB — IGG, IGA, IGM
IgA: 162 mg/dL (ref 87–352)
IgG (Immunoglobin G), Serum: 635 mg/dL (ref 586–1602)
IgM (Immunoglobulin M), Srm: 103 mg/dL (ref 26–217)

## 2020-05-22 LAB — IMMUNOFIXATION ELECTROPHORESIS
IgA: 154 mg/dL (ref 87–352)
IgG (Immunoglobin G), Serum: 650 mg/dL (ref 586–1602)
IgM (Immunoglobulin M), Srm: 102 mg/dL (ref 26–217)
Total Protein ELP: 6.9 g/dL (ref 6.0–8.5)

## 2020-05-27 ENCOUNTER — Inpatient Hospital Stay (HOSPITAL_BASED_OUTPATIENT_CLINIC_OR_DEPARTMENT_OTHER): Payer: BC Managed Care – PPO | Admitting: Hematology

## 2020-05-27 DIAGNOSIS — D472 Monoclonal gammopathy: Secondary | ICD-10-CM

## 2020-05-28 NOTE — Progress Notes (Signed)
Virtual Visit via Telephone Note  I connected with [name] on 05/28/20 at  4:15 PM EDT by telephone and verified that I am speaking with the correct person using two identifiers.   I discussed the limitations, risks, security and privacy concerns of performing an evaluation and management service by telephone and the availability of in person appointments. I also discussed with the patient that there may be a patient responsible charge related to this service. The patient expressed understanding and agreed to proceed.   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   Observations/Objective: She denies any B symptoms.  No new bone pains.  Assessment and Plan:  1.  Abnormal SPEP: -I have reviewed results of SPEP which did not show M spike. -Quantitative immunoglobulins are normal. -Serum immunofixation was normal. -She does not have any abnormalities.  No further testing is needed.   Follow Up Instructions: RTC as needed.   I discussed the assessment and treatment plan with the patient. The patient was provided an opportunity to ask questions and all were answered. The patient agreed with the plan and demonstrated an understanding of the instructions.   The patient was advised to call back or seek an in-person evaluation if the symptoms worsen or if the condition fails to improve as anticipated.  I provided 7 minutes of non-face-to-face time during this encounter.   Derek Jack, MD

## 2020-06-25 ENCOUNTER — Encounter: Payer: Self-pay | Admitting: Family Medicine

## 2020-06-25 ENCOUNTER — Other Ambulatory Visit: Payer: Self-pay

## 2020-06-25 ENCOUNTER — Ambulatory Visit: Payer: BC Managed Care – PPO | Admitting: Family Medicine

## 2020-06-25 VITALS — BP 138/78 | HR 84 | Ht 64.0 in | Wt 145.0 lb

## 2020-06-25 DIAGNOSIS — I10 Essential (primary) hypertension: Secondary | ICD-10-CM

## 2020-06-25 DIAGNOSIS — Z23 Encounter for immunization: Secondary | ICD-10-CM

## 2020-06-25 DIAGNOSIS — G43909 Migraine, unspecified, not intractable, without status migrainosus: Secondary | ICD-10-CM | POA: Diagnosis not present

## 2020-06-25 NOTE — Patient Instructions (Addendum)
°  HAPPY FALL!  I appreciate the opportunity to provide you with care for your health and wellness. Today we discussed: blood pressure   Follow up: July (after 28th) for CPE -fasting   No labs or referrals today  I hope your mom gets better soon!  Have a good Holiday Season!  Be safe! Call if you need anything.  Please continue to practice social distancing to keep you, your family, and our community safe.  If you must go out, please wear a mask and practice good handwashing.  It was a pleasure to see you and I look forward to continuing to work together on your health and well-being. Please do not hesitate to call the office if you need care or have questions about your care.  Have a wonderful day and week. With Gratitude, Cherly Beach, DNP, AGNP-BC

## 2020-06-25 NOTE — Assessment & Plan Note (Signed)
Patient was educated on the recommendation for flu vaccine. After obtaining informed consent, the vaccine was administered no adverse effects noted at time of administration. Patient provided with education on arm soreness and use of tylenol or ibuprofen (if safe) for this. Encourage to use the arm vaccine was given in to help reduce the soreness. Patient educated on the signs of a reaction to the vaccine and advised to contact the office should these occur.

## 2020-06-25 NOTE — Assessment & Plan Note (Signed)
Overall doing well. Has lost some weight which might help with BP control. Needs to maintain a good well balanced diet and exercise more frequently is encouraged. Patient acknowledged agreement and understanding of the plan.

## 2020-06-25 NOTE — Assessment & Plan Note (Signed)
Intermittent control related to increase stress from caring for her mom. She might need a med change and referral to neurology as previously discussed she would like to wait for now.

## 2020-06-25 NOTE — Progress Notes (Signed)
Subjective:  Patient ID: [name], female    DOB: 30-May-1976  Age: 44 y.o. MRN: [id number]  CC:  Chief Complaint  Patient presents with  . Follow-up    b/p       HPI  HPI   [name] is a 44 year old female who presents today for follow up visit regarding HTN.  Limited history as stated below.  Overall she reports that she is doing well she does have a lot of increased stress secondary to her mother being ill with uncontrolled diabetes and a diabetic foot ulcer of which she has to go and change the dressing on daily.  She reports that that stress level, increases her migraines but overall she has been doing okay.  Blood pressure is better today than it was during her first visit in July.  And is in the controlled range.  She would like to refrain from going on any medication she has lost a pound of weight.  And is going to getting back into exercising.  She denies having any sleep trouble.  Denies having trouble chewing or swallowing.  Denies having trouble using the restroom or changes in bowel or bladder habits.  Denies having any memory issues.  Denies having any falls or injuries.  Denies having any hearing or vision changes.  Does continue to have a rash on the left cord which she takes the cream for that we prescribed back in July.  She has had no recent contacts with Covid or sinus-like symptoms.  She denies having any chest pain, palpitations, leg swelling, cough, shortness of breath, or dizziness.  Today patient denies signs and symptoms of COVID 19 infection including fever, chills, cough, shortness of breath, and headache. Past Medical, Surgical, Social History, Allergies, and Medications have been Reviewed.   Past Medical History:  Diagnosis Date  . Abnormal uterine bleeding 11/16/2011  . Back problem 03/23/2020  . 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 Dysmenorrhea 11/16/2011  . Headache(784.0)    migraines  . 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 S/P laparoscopic  assisted vaginal hysterectomy (LAVH) 11/16/2011  . Single liveborn, born in hospital, delivered by cesarean delivery 08/28/1994  . Uterus, adenomyosis 11/16/2011    Current Meds  Medication Sig  . aspirin-acetaminophen-caffeine (EXCEDRIN MIGRAINE) 250-250-65 MG per tablet Take 2 tablets by mouth as needed. Severe headache   . Aspirin-Acetaminophen-Caffeine (GOODYS EXTRA STRENGTH PO) Take 1 packet by mouth as needed.  . topiramate (TOPAMAX) 100 MG tablet Take 1 tablet (100 mg total) by mouth 2 (two) times daily.  Marland Kitchen triamcinolone (KENALOG) 0.025 % ointment Apply 1 application topically daily.    ROS:  Review of Systems  Constitutional: Negative.   HENT: Negative.   Eyes: Negative.   Respiratory: Negative.   Cardiovascular: Negative.   Gastrointestinal: Negative.   Genitourinary: Negative.   Musculoskeletal: Negative.   Skin: Negative.   Neurological: Positive for headaches.  Endo/Heme/Allergies: Negative.   Psychiatric/Behavioral: Negative.      Objective:   Today's Vitals: BP 138/78   Pulse 84   Ht 5' 4"  (1.626 m)   Wt 145 lb (65.8 kg)   LMP 10/17/2011   SpO2 96%   BMI 24.89 kg/m  Vitals with BMI 06/25/2020 05/20/2020 04/22/2020  Height 5' 4"  - 5' 4"   Weight 145 lbs 145 lbs 10 oz 146 lbs  BMI 61.95 - 09.32  Systolic [phone]  Diastolic 78 82 82  Pulse 84 70 -  Physical Exam Vitals and nursing note reviewed.  Constitutional:      Appearance: Normal appearance. She is well-developed, well-groomed and normal weight.  HENT:     Head: Normocephalic and atraumatic.     Right Ear: External ear normal.     Left Ear: External ear normal.     Mouth/Throat:     Comments: Mask in place  Eyes:     General:        Right eye: No discharge.        Left eye: No discharge.     Conjunctiva/sclera: Conjunctivae normal.  Cardiovascular:     Rate and Rhythm: Normal rate and regular rhythm.     Pulses: Normal pulses.     Heart sounds: Normal heart sounds.  Pulmonary:      Effort: Pulmonary effort is normal.     Breath sounds: Normal breath sounds.  Musculoskeletal:        General: Normal range of motion.     Cervical back: Normal range of motion and neck supple.  Skin:    General: Skin is warm.  Neurological:     General: No focal deficit present.     Mental Status: She is alert and oriented to person, place, and time.  Psychiatric:        Attention and Perception: Attention normal.        Mood and Affect: Mood normal.        Speech: Speech normal.        Behavior: Behavior normal. Behavior is cooperative.        Thought Content: Thought content normal.        Cognition and Memory: Cognition normal.        Judgment: Judgment normal.      Assessment   1. Need for immunization against influenza   2. Essential hypertension   3. Migraine without status migrainosus, not intractable, unspecified migraine type     Tests ordered Orders Placed This Encounter  Procedures  . Flu Vaccine QUAD 36+ mos IM     Plan: Please see assessment and plan per problem list above.   No orders of the defined types were placed in this encounter.   Patient to follow-up in  July CPE   Note: This dictation was prepared with Dragon dictation along with smaller phrase technology. Similar sounding words can be transcribed inadequately or may not be corrected upon review. Any transcriptional errors that result from this process are unintentional.      Perlie Mayo, NP

## 2020-06-28 ENCOUNTER — Other Ambulatory Visit: Payer: Self-pay | Admitting: Family Medicine

## 2020-06-28 DIAGNOSIS — G43909 Migraine, unspecified, not intractable, without status migrainosus: Secondary | ICD-10-CM

## 2020-06-28 MED ORDER — TOPIRAMATE 100 MG PO TABS: 100.0000 mg | ORAL_TABLET | Freq: Two times a day (BID) | ORAL | 0 refills | Status: DC

## 2020-09-29 ENCOUNTER — Other Ambulatory Visit: Payer: Self-pay | Admitting: Family Medicine

## 2020-09-29 DIAGNOSIS — L309 Dermatitis, unspecified: Secondary | ICD-10-CM

## 2020-09-29 DIAGNOSIS — G43909 Migraine, unspecified, not intractable, without status migrainosus: Secondary | ICD-10-CM

## 2020-09-29 MED ORDER — TRIAMCINOLONE ACETONIDE 0.025 % EX OINT: 1.0000 "application " | TOPICAL_OINTMENT | Freq: Every day | CUTANEOUS | 0 refills | Status: DC

## 2020-09-29 MED ORDER — TOPIRAMATE 100 MG PO TABS: 100.0000 mg | ORAL_TABLET | Freq: Two times a day (BID) | ORAL | 0 refills | Status: DC

## 2020-12-25 ENCOUNTER — Other Ambulatory Visit: Payer: Self-pay | Admitting: Family Medicine

## 2020-12-25 DIAGNOSIS — G43909 Migraine, unspecified, not intractable, without status migrainosus: Secondary | ICD-10-CM

## 2021-03-25 ENCOUNTER — Encounter: Payer: BC Managed Care – PPO | Admitting: Family Medicine

## 2021-03-28 ENCOUNTER — Other Ambulatory Visit: Payer: Self-pay

## 2021-03-28 DIAGNOSIS — G43909 Migraine, unspecified, not intractable, without status migrainosus: Secondary | ICD-10-CM

## 2021-03-28 MED ORDER — TOPIRAMATE 100 MG PO TABS: ORAL_TABLET | ORAL | 0 refills | Status: DC

## 2021-04-01 ENCOUNTER — Encounter: Payer: BC Managed Care – PPO | Admitting: Family Medicine

## 2021-04-15 ENCOUNTER — Encounter: Payer: BC Managed Care – PPO | Admitting: Internal Medicine

## 2021-05-12 ENCOUNTER — Other Ambulatory Visit: Payer: Self-pay

## 2021-05-12 ENCOUNTER — Encounter: Payer: Self-pay | Admitting: Nurse Practitioner

## 2021-05-12 ENCOUNTER — Ambulatory Visit (INDEPENDENT_AMBULATORY_CARE_PROVIDER_SITE_OTHER): Payer: BC Managed Care – PPO | Admitting: Nurse Practitioner

## 2021-05-12 VITALS — BP 134/91 | HR 78 | Temp 98.3°F | Ht 64.0 in | Wt 145.0 lb

## 2021-05-12 DIAGNOSIS — G43909 Migraine, unspecified, not intractable, without status migrainosus: Secondary | ICD-10-CM

## 2021-05-12 DIAGNOSIS — I10 Essential (primary) hypertension: Secondary | ICD-10-CM | POA: Diagnosis not present

## 2021-05-12 DIAGNOSIS — Z139 Encounter for screening, unspecified: Secondary | ICD-10-CM | POA: Diagnosis not present

## 2021-05-12 DIAGNOSIS — Z0001 Encounter for general adult medical examination with abnormal findings: Secondary | ICD-10-CM | POA: Diagnosis not present

## 2021-05-12 NOTE — Progress Notes (Signed)
Established Patient Office Visit  Subjective:  Patient ID: [name], female    DOB: [birth year]  Age: 45 y.o. MRN: [id number]  CC:  Chief Complaint  Patient presents with   Annual Exam    CPE    HPI [name] presents for physical exam.  No acute concerns.  She has hx of migraines and states that she takes topiramate, and has very few headaches per month.  She sees GYN for PAP.  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   Dysmenorrhea 11/16/2011   Headache(784.0)    migraines   Polyp of corpus uteri 11/16/2011   S/P laparoscopic assisted vaginal hysterectomy (LAVH) 11/16/2011   Single liveborn, born in hospital, delivered by cesarean delivery 08/28/1994   Uterus, adenomyosis 11/16/2011    Past Surgical History:  Procedure Laterality Date   ABDOMINAL HYSTERECTOMY     [place]  11/16/2011   Procedure: LAPAROSCOPIC ASSISTED VAGINAL HYSTERECTOMY;  Surgeon: Thornell Sartorius, MD;  Location: [facility];  Service: Gynecology;  Laterality: N/A;   LIPOMA EXCISION  2008   SALPINGOOPHORECTOMY  11/16/2011   Procedure: SALPINGO OOPHERECTOMY;  Surgeon: Thornell Sartorius, MD;  Location: [facility];  Service: Gynecology;  Laterality: Bilateral;   TUBAL LIGATION      Family History  Problem Relation Age of Onset   Diabetes Mother    Hypertension Mother    Hyperlipidemia Mother    Cancer Father        kideny cancer-removed    Stroke Father    Heart disease Maternal Grandmother    Heart disease Maternal Grandfather    Heart disease Paternal Grandmother    Heart disease Paternal Grandfather    Cancer Paternal Grandfather    Headache Sister    Headache Brother    Headache Sister     Social History   Socioeconomic History   Marital status: Married    Spouse name: Holiday representative    Number of children: 2   Years of  education: Not on file   Highest education level: High school graduate  Occupational History    Employer:  [employer]  Tobacco Use   Smoking status: Never   Smokeless tobacco: Never  Substance and Sexual Activity   Alcohol use: Yes    Comment: rarely   Drug use: No   Sexual activity: Yes    Birth control/protection: Surgical  Other Topics Concern   Not on file  Social History Narrative   Lives with husband Abe People married 68 years    2 daughters: youngest is in [place] for summer; oldest moved to [place]      Dog: bloodhound- Lucy    Rabbit: Mario       Enjoy: collecting crafts, going to [employer], spending time with family       Diet: eats all food groups    Caffeine: coffee -2 cups daily     Water: 6-8 cups daily       Wears seat belt    Handsfree phone while driving    Oceanographer at home   Designer, 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 Severe headache      Aspirin-Acetaminophen-Caffeine (GOODYS EXTRA STRENGTH PO) Take 1 packet by mouth as needed.     topiramate (TOPAMAX) 100 MG tablet TAKE 1 TABLET(100 MG) BY MOUTH TWICE DAILY 180 tablet 0   triamcinolone (KENALOG) 0.025 % ointment Apply 1 application topically daily. 30 g 0   No facility-administered medications prior to visit.    No Known Allergies  ROS Review of Systems  Constitutional: Negative.   HENT: Negative.    Eyes: Negative.   Respiratory: Negative.    Cardiovascular: Negative.   Gastrointestinal: Negative.   Endocrine: Negative.   Genitourinary: Negative.    Musculoskeletal: Negative.   Skin: Negative.   Allergic/Immunologic: Negative.   Neurological: Negative.   Hematological: Negative.   Psychiatric/Behavioral: Negative.       Objective:    Physical Exam Constitutional:      Appearance: Normal appearance.  HENT:     Head: Normocephalic and atraumatic.     Right Ear: Tympanic membrane, ear canal and external ear normal.     Left Ear: Tympanic membrane, ear canal and external ear normal.     Nose: Nose normal.     Mouth/Throat:     Mouth: Mucous membranes are moist.     Pharynx: Oropharynx is clear.  Eyes:     Extraocular Movements: Extraocular movements intact.     Conjunctiva/sclera: Conjunctivae normal.     Pupils: Pupils are equal, round, and reactive to light.  Cardiovascular:     Rate and Rhythm: Normal rate and regular rhythm.     Pulses: Normal pulses.     Heart sounds: Normal heart sounds.  Pulmonary:     Effort: Pulmonary effort is normal.     Breath sounds: Normal breath sounds.  Abdominal:     General: Abdomen is flat. Bowel sounds are normal.     Palpations: Abdomen is soft.  Musculoskeletal:        General: Normal range of motion.     Cervical back: Normal range of motion and neck supple.  Skin:    General: Skin is warm.     Capillary Refill: Capillary refill takes less than 2 seconds.  Neurological:     General: No focal deficit present.     Mental Status: She is alert and oriented to person, place, and time.  Psychiatric:        Mood and Affect: Mood normal.        Behavior: Behavior normal.        Thought Content: Thought content normal.        Judgment: Judgment normal.    BP (!) 134/91 (BP Location: Right Arm, Patient Position: Sitting, Cuff Size: Normal)   Pulse 78   Temp 98.3 F (36.8 C) (Oral)   Ht 5' 4"  (1.626 m)   Wt 145 lb (65.8 kg)   LMP 10/17/2011   SpO2 96%   BMI 24.89 kg/m  Wt Readings from Last 3 Encounters:  05/12/21 145 lb (65.8 kg)  06/25/20 145 lb (65.8 kg)  05/20/20 145  lb 9.6 oz (66 kg)     Health Maintenance Due  Topic Date Due   HIV Screening  Never done   Hepatitis C Screening  Never done   PAP SMEAR-Modifier  Never done   COLONOSCOPY (Pts 45-50yr Insurance coverage will need to be confirmed)  Never done    There are no preventive care reminders to display for this patient.  No results found for: TSH Lab Results  Component Value Date   WBC 6.3 03/24/2020   HGB 13.1 03/24/2020   HCT 38.9 03/24/2020   MCV 88 03/24/2020   PLT 196 03/24/2020   Lab Results  Component Value Date   NA 142 03/24/2020   K 3.9 03/24/2020   CO2 21 03/24/2020   GLUCOSE 93 03/24/2020   BUN 13 03/24/2020   CREATININE 0.75 03/24/2020   BILITOT 0.3 03/24/2020   ALKPHOS 73 03/24/2020   AST 10 03/24/2020   ALT 12 03/24/2020   PROT 6.5 03/24/2020   ALBUMIN 4.8 03/24/2020   CALCIUM 9.3 03/24/2020   Lab Results  Component Value Date   CHOL 147 03/24/2020   Lab Results  Component Value Date   HDL 49 03/24/2020   Lab Results  Component Value Date   LDLCALC 84 03/24/2020   Lab Results  Component Value Date   TRIG 70 03/24/2020   Lab Results  Component Value Date   CHOLHDL 3.0 03/24/2020   Lab Results  Component Value Date   HGBA1C 4.8 03/24/2020      Assessment & Plan:   Problem List Items Addressed This Visit       Cardiovascular and Mediastinum   Migraine    -doing well with current regimen -we discussed Nurtec, but she states her migraines are well controlled, so no need for Rx. today      Essential hypertension    BP Readings from Last 3 Encounters:  05/12/21 (!) 134/91  06/25/20 138/78  05/20/20 126/82  -slightly elevated BP today -if elevated with next reading, may consider changing therapy        Other   Encounter for general adult medical examination with abnormal findings - Primary    -up to date with screenings and immunizations      Relevant Orders   CMP14+EGFR   CBC with Differential/Platelet   Lipid Panel With  LDL/HDL Ratio   Hepatitis C antibody   HIV Antibody (routine testing w rflx)   TSH   Other Visit Diagnoses     Screening due       Relevant Orders   Hepatitis C antibody   HIV Antibody (routine testing w rflx)       No orders of the defined types were placed in this encounter.   Follow-up: Return in about 1 year (around 05/12/2022) for Physical Exam.    JNoreene Larsson NP

## 2021-05-12 NOTE — Assessment & Plan Note (Signed)
-  doing well with current regimen -we discussed Nurtec, but she states her migraines are well controlled, so no need for Rx. today

## 2021-05-12 NOTE — Assessment & Plan Note (Addendum)
BP Readings from Last 3 Encounters:  05/12/21 (!) 134/91  06/25/20 138/78  05/20/20 126/82   -slightly elevated BP today -if elevated with next reading, may consider changing therapy

## 2021-05-12 NOTE — Assessment & Plan Note (Signed)
-  up to date with screenings and immunizations

## 2021-05-12 NOTE — Patient Instructions (Signed)
Please have fasting labs drawn today.

## 2021-05-13 LAB — LIPID PANEL WITH LDL/HDL RATIO
Cholesterol, Total: 151 mg/dL (ref 100–199)
HDL: 40 mg/dL (ref >39)
LDL Chol Calc (NIH): 92 mg/dL (ref 0–99)
LDL/HDL Ratio: 2.3 ratio (ref 0.0–3.2)
Triglycerides: 101 mg/dL (ref 0–149)
VLDL Cholesterol Cal: 19 mg/dL (ref 5–40)

## 2021-05-13 LAB — CMP14+EGFR
ALT: 11 IU/L (ref 0–32)
AST: 14 IU/L (ref 0–40)
Albumin/Globulin Ratio: 1.9 (ref 1.2–2.2)
Albumin: 4.6 g/dL (ref 3.8–4.8)
Alkaline Phosphatase: 77 IU/L (ref 44–121)
BUN/Creatinine Ratio: 18 (ref 9–23)
BUN: 13 mg/dL (ref 6–24)
Bilirubin Total: 0.4 mg/dL (ref 0.0–1.2)
CO2: 21 mmol/L (ref 20–29)
Calcium: 9.2 mg/dL (ref 8.7–10.2)
Chloride: 109 mmol/L — ABNORMAL HIGH (ref 96–106)
Creatinine, Ser: 0.71 mg/dL (ref 0.57–1.00)
Globulin, Total: 2.4 g/dL (ref 1.5–4.5)
Glucose: 82 mg/dL (ref 65–99)
Potassium: 3.7 mmol/L (ref 3.5–5.2)
Sodium: 144 mmol/L (ref 134–144)
Total Protein: 7 g/dL (ref 6.0–8.5)
eGFR: 107 mL/min/{1.73_m2} (ref >59)

## 2021-05-13 LAB — CBC WITH DIFFERENTIAL/PLATELET
Basophils Absolute: 0.1 10*3/uL (ref 0.0–0.2)
Basos: 1 %
EOS (ABSOLUTE): 0.1 10*3/uL (ref 0.0–0.4)
Eos: 2 %
Hematocrit: 39.4 % (ref 34.0–46.6)
Hemoglobin: 13 g/dL (ref 11.1–15.9)
Immature Grans (Abs): 0 10*3/uL (ref 0.0–0.1)
Immature Granulocytes: 0 %
Lymphocytes Absolute: 1.8 10*3/uL (ref 0.7–3.1)
Lymphs: 28 %
MCH: 28.4 pg (ref 26.6–33.0)
MCHC: 33 g/dL (ref 31.5–35.7)
MCV: 86 fL (ref 79–97)
Monocytes Absolute: 0.3 10*3/uL (ref 0.1–0.9)
Monocytes: 4 %
Neutrophils Absolute: 4.2 10*3/uL (ref 1.4–7.0)
Neutrophils: 65 %
Platelets: 207 10*3/uL (ref 150–450)
RBC: 4.57 x10E6/uL (ref 3.77–5.28)
RDW: 12.6 % (ref 11.7–15.4)
WBC: 6.5 10*3/uL (ref 3.4–10.8)

## 2021-05-13 LAB — HEPATITIS C ANTIBODY: Hep C Virus Ab: <0.1 s/co ratio (ref 0.0–0.9)

## 2021-05-13 LAB — HIV ANTIBODY (ROUTINE TESTING W REFLEX): HIV Screen 4th Generation wRfx: NONREACTIVE

## 2021-05-13 LAB — TSH: TSH: 2.24 u[IU]/mL (ref 0.450–4.500)

## 2021-05-13 NOTE — Progress Notes (Signed)
Labs look great.

## 2021-06-25 ENCOUNTER — Ambulatory Visit (INDEPENDENT_AMBULATORY_CARE_PROVIDER_SITE_OTHER): Payer: BC Managed Care – PPO

## 2021-06-25 ENCOUNTER — Other Ambulatory Visit: Payer: Self-pay

## 2021-06-25 ENCOUNTER — Ambulatory Visit
Admission: EM | Admit: 2021-06-25 | Discharge: 2021-06-25 | Disposition: A | Discharge status: Home / Self Care | Payer: BC Managed Care – PPO | Attending: Urgent Care | Admitting: Urgent Care

## 2021-06-25 ENCOUNTER — Encounter: Payer: Self-pay | Admitting: Emergency Medicine

## 2021-06-25 DIAGNOSIS — S8392XA Sprain of unspecified site of left knee, initial encounter: Secondary | ICD-10-CM

## 2021-06-25 DIAGNOSIS — M25562 Pain in left knee: Secondary | ICD-10-CM | POA: Diagnosis not present

## 2021-06-25 MED ORDER — NAPROXEN 500 MG PO TABS: 500.0000 mg | ORAL_TABLET | Freq: Two times a day (BID) | ORAL | 0 refills | Status: AC

## 2021-06-25 NOTE — ED Triage Notes (Signed)
Think she may have twisted her left knee.  Felt pain in left knee x 2 weeks ago when this happened.  Feels pain on the lateral side of knee.

## 2021-06-25 NOTE — ED Provider Notes (Signed)
[place]   MRN: [id number] DOB: 1976-02-17  Subjective:   [name] is a 45 y.o. female presenting for 2-week history of persistent left knee pain.  Symptoms started when she tried to jump off the couch and somehow twisted her knee.  Since then she has had persistent intermittent knee pain not happening daily.  Has not had any particular swelling, bruising.  Has been using Advil with very temporary relief.  No history of musculoskeletal disorders.  No current facility-administered medications for this encounter.  Current Outpatient Medications:    aspirin-acetaminophen-caffeine (EXCEDRIN MIGRAINE) 250-250-65 MG per tablet, Take 2 tablets by mouth as needed. 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   Dysmenorrhea 11/16/2011   Headache(784.0)    migraines   Polyp of corpus uteri 11/16/2011   S/P laparoscopic assisted vaginal hysterectomy (LAVH) 11/16/2011   Single liveborn, born in hospital, delivered by cesarean delivery 08/28/1994   Uterus, adenomyosis 11/16/2011     Past Surgical History:  Procedure Laterality Date   ABDOMINAL HYSTERECTOMY     [place]  11/16/2011   Procedure: LAPAROSCOPIC ASSISTED VAGINAL HYSTERECTOMY;  Surgeon: Thornell Sartorius, MD;  Location: [facility];  Service: Gynecology;  Laterality: N/A;   LIPOMA EXCISION  2008   SALPINGOOPHORECTOMY  11/16/2011   Procedure: SALPINGO OOPHERECTOMY;  Surgeon: Thornell Sartorius,  MD;  Location: [facility];  Service: Gynecology;  Laterality: Bilateral;   TUBAL LIGATION      Family History  Problem Relation Age of Onset   Diabetes Mother    Hypertension Mother    Hyperlipidemia Mother    Cancer Father        kideny cancer-removed    Stroke Father    Heart disease Maternal Grandmother    Heart disease Maternal Grandfather    Heart disease Paternal Grandmother    Heart disease Paternal Grandfather    Cancer Paternal Grandfather    Headache Sister    Headache Brother    Headache Sister     Social History   Tobacco Use   Smoking status: Never   Smokeless tobacco: Never  Substance Use Topics   Alcohol use: Yes    Comment: rarely   Drug use: No    ROS   Objective:   Vitals: BP (!) 159/88 (BP Location: Right Arm)   Pulse 85   Temp 98.6 F (37 C) (Oral)   Resp 18   LMP 10/17/2011   SpO2 98%   Physical Exam Constitutional:      General: She is not in acute distress.    Appearance: Normal appearance. She is well-developed. She is not ill-appearing, toxic-appearing or diaphoretic.  HENT:     Head: Normocephalic and atraumatic.     Nose: Nose normal.     Mouth/Throat:     Mouth: Mucous membranes are moist.     Pharynx: Oropharynx is clear.  Eyes:     General: No scleral icterus.  Right eye: No discharge.        Left eye: No discharge.     Extraocular Movements: Extraocular movements intact.     Conjunctiva/sclera: Conjunctivae normal.     Pupils: Pupils are equal, round, and reactive to light.  Cardiovascular:     Rate and Rhythm: Normal rate.  Pulmonary:     Effort: Pulmonary effort is normal.  Musculoskeletal:     Left knee: No swelling, deformity, effusion, erythema, ecchymosis, lacerations, bony tenderness or crepitus. Normal range of motion. Tenderness present over the lateral joint line and patellar tendon. No medial joint line tenderness. Normal alignment and normal patellar mobility.  Skin:    General: Skin is warm and dry.   Neurological:     General: No focal deficit present.     Mental Status: She is alert and oriented to person, place, and time.     Motor: No weakness.     Coordination: Coordination normal.     Gait: Gait normal.     Deep Tendon Reflexes: Reflexes normal.  Psychiatric:        Mood and Affect: Mood normal.        Behavior: Behavior normal.        Thought Content: Thought content normal.        Judgment: Judgment normal.    DG Knee Complete 4 Views Left  Result Date: 06/25/2021 CLINICAL DATA:  Left knee pain after twisting injury 2 weeks ago. EXAM: LEFT KNEE - COMPLETE 4+ VIEW COMPARISON:  None. FINDINGS: No evidence of fracture, dislocation, or joint effusion. No evidence of arthropathy or other focal bone abnormality. Soft tissues are unremarkable. IMPRESSION: Negative. Electronically Signed   By: Jacqulynn Cadet M.D.   On: 06/25/2021 09:24     Assessment and Plan :   PDMP not reviewed this encounter.  1. Sprain of left knee, unspecified ligament, initial encounter   2. Acute pain of left knee    Negative x-ray. Will manage with NSAID, Ace wrap (applied 4" wrap to the left knee). Counseled patient on potential for adverse effects with medications prescribed/recommended today, ER and return-to-clinic precautions discussed, patient verbalized understanding.    Jaynee Eagles, [place] 06/25/21 [phone]

## 2021-06-27 ENCOUNTER — Other Ambulatory Visit: Payer: Self-pay

## 2021-06-27 DIAGNOSIS — G43909 Migraine, unspecified, not intractable, without status migrainosus: Secondary | ICD-10-CM

## 2021-06-27 MED ORDER — TOPIRAMATE 100 MG PO TABS: ORAL_TABLET | ORAL | 0 refills | Status: DC

## 2021-09-29 ENCOUNTER — Other Ambulatory Visit: Payer: Self-pay | Admitting: Internal Medicine

## 2021-09-29 DIAGNOSIS — G43909 Migraine, unspecified, not intractable, without status migrainosus: Secondary | ICD-10-CM

## 2021-10-11 ENCOUNTER — Other Ambulatory Visit: Payer: Self-pay | Admitting: Family Medicine

## 2021-10-11 DIAGNOSIS — L309 Dermatitis, unspecified: Secondary | ICD-10-CM

## 2021-10-11 MED ORDER — TRIAMCINOLONE ACETONIDE 0.025 % EX OINT: 1.0000 "application " | TOPICAL_OINTMENT | Freq: Every day | CUTANEOUS | 0 refills | Status: AC

## 2021-12-26 ENCOUNTER — Other Ambulatory Visit: Payer: Self-pay | Admitting: Internal Medicine

## 2021-12-26 DIAGNOSIS — G43909 Migraine, unspecified, not intractable, without status migrainosus: Secondary | ICD-10-CM

## 2022-03-25 ENCOUNTER — Other Ambulatory Visit: Payer: Self-pay | Admitting: Internal Medicine

## 2022-03-25 DIAGNOSIS — G43909 Migraine, unspecified, not intractable, without status migrainosus: Secondary | ICD-10-CM

## 2022-05-16 ENCOUNTER — Encounter: Payer: BC Managed Care – PPO | Admitting: Nurse Practitioner

## 2022-07-28 IMAGING — DX DG KNEE COMPLETE 4+V*L*
4 series · 4 of 4 positions shown · non-contrast
Comparison: None.

CLINICAL DATA: Left knee pain after twisting injury 2 weeks ago.

EXAM:
LEFT KNEE - COMPLETE 4+ VIEW

[knee ap]
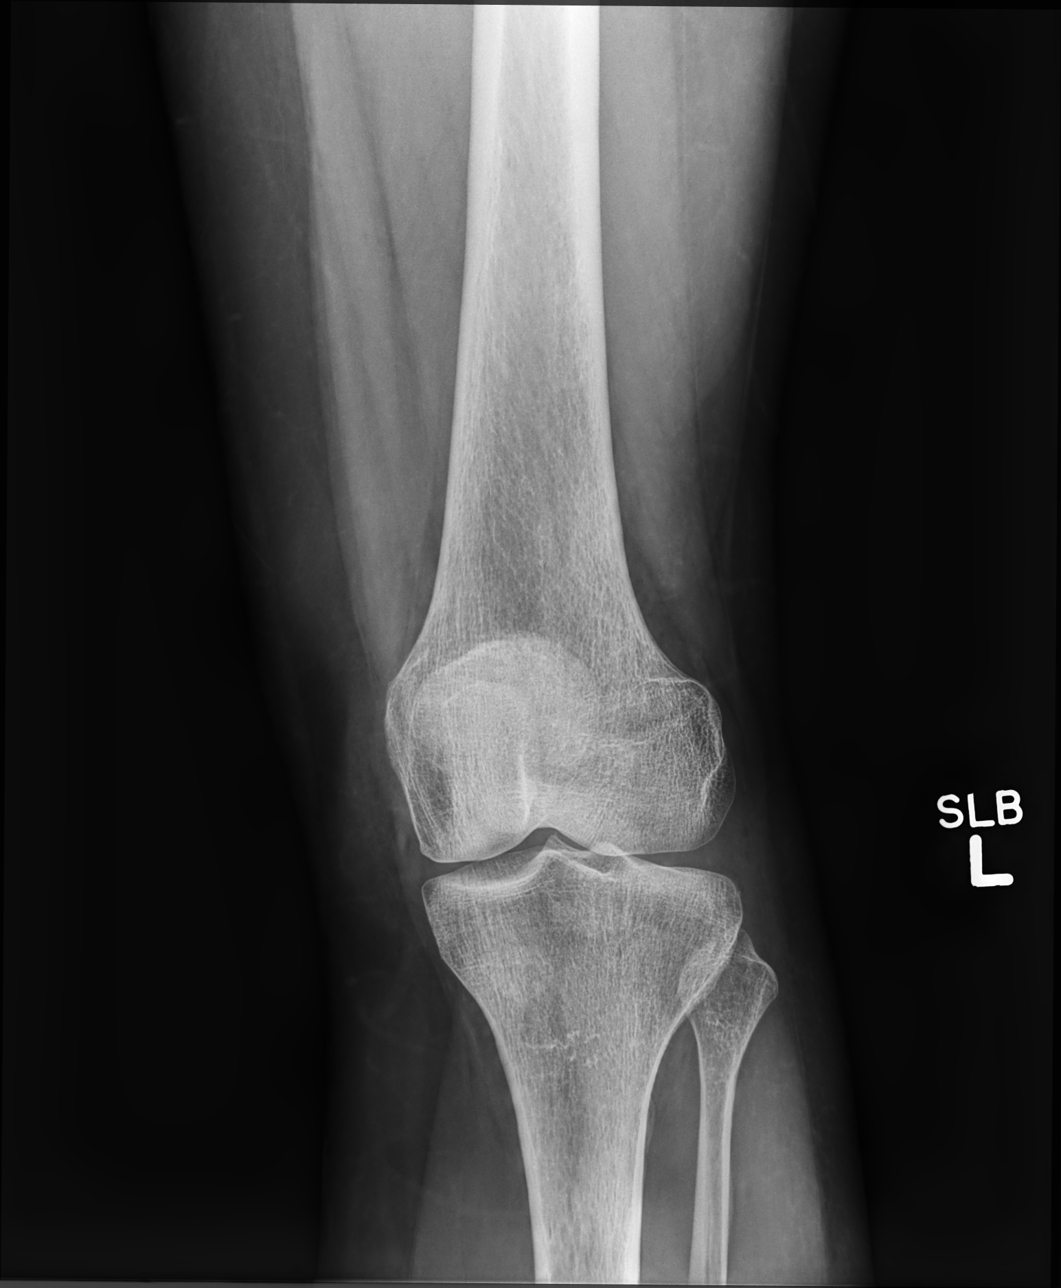

[knee mlo (1 of 2)]
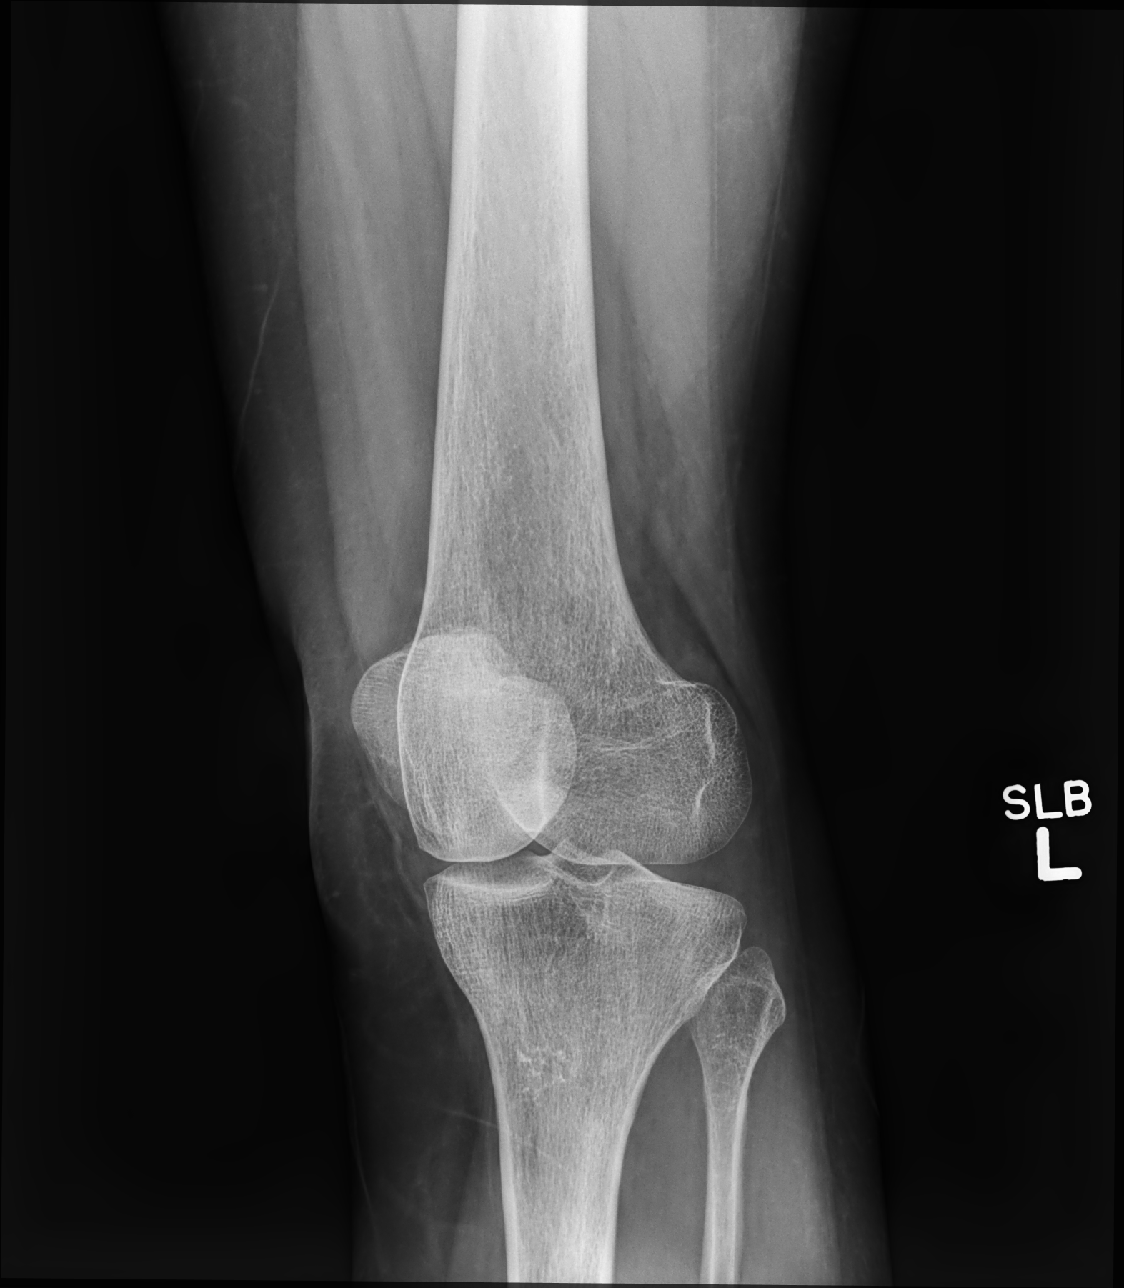

[knee mlo (2 of 2)]
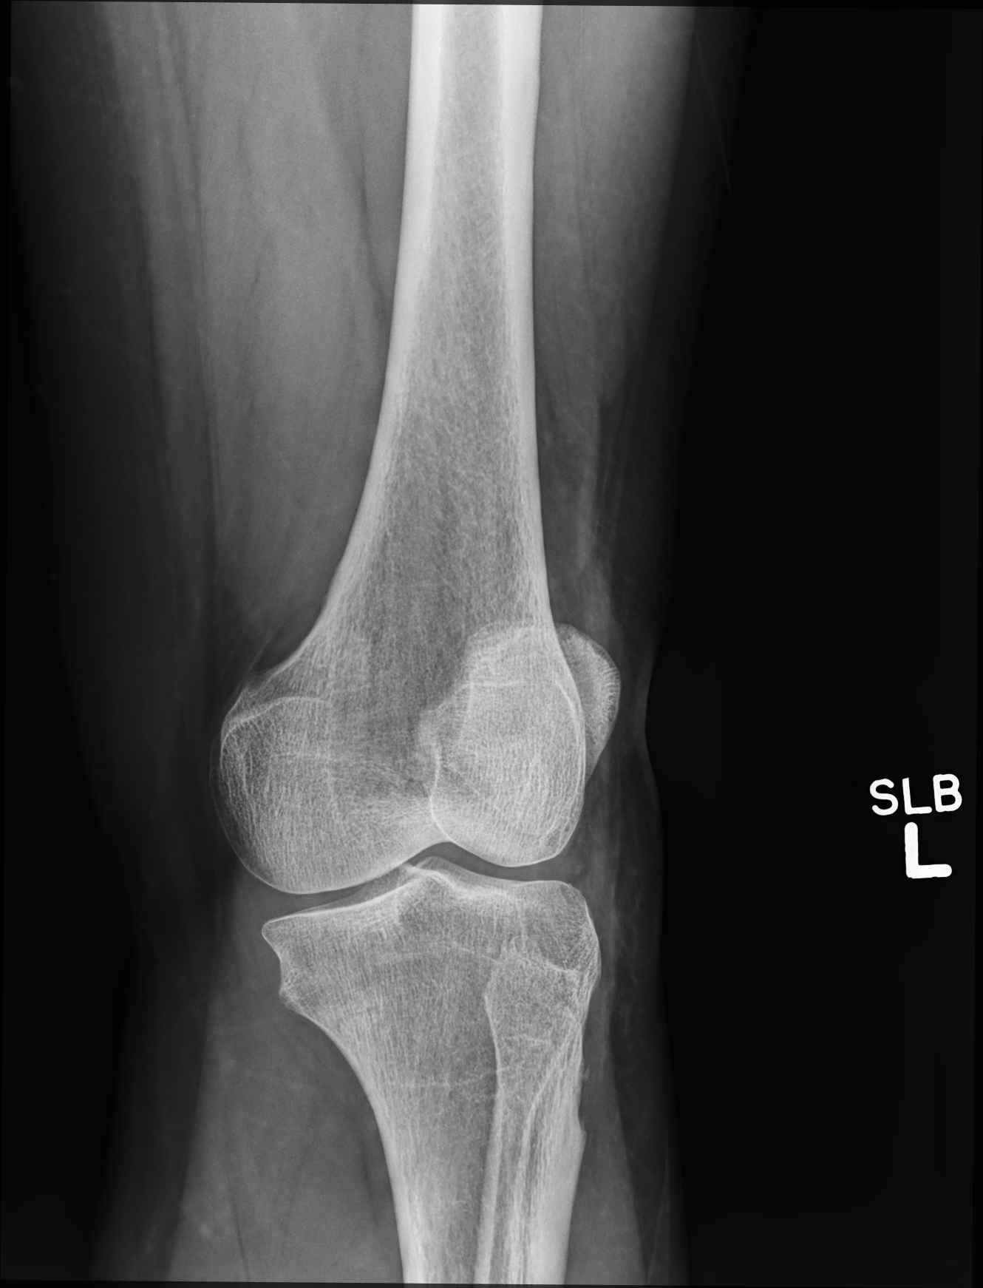

[knee lat]
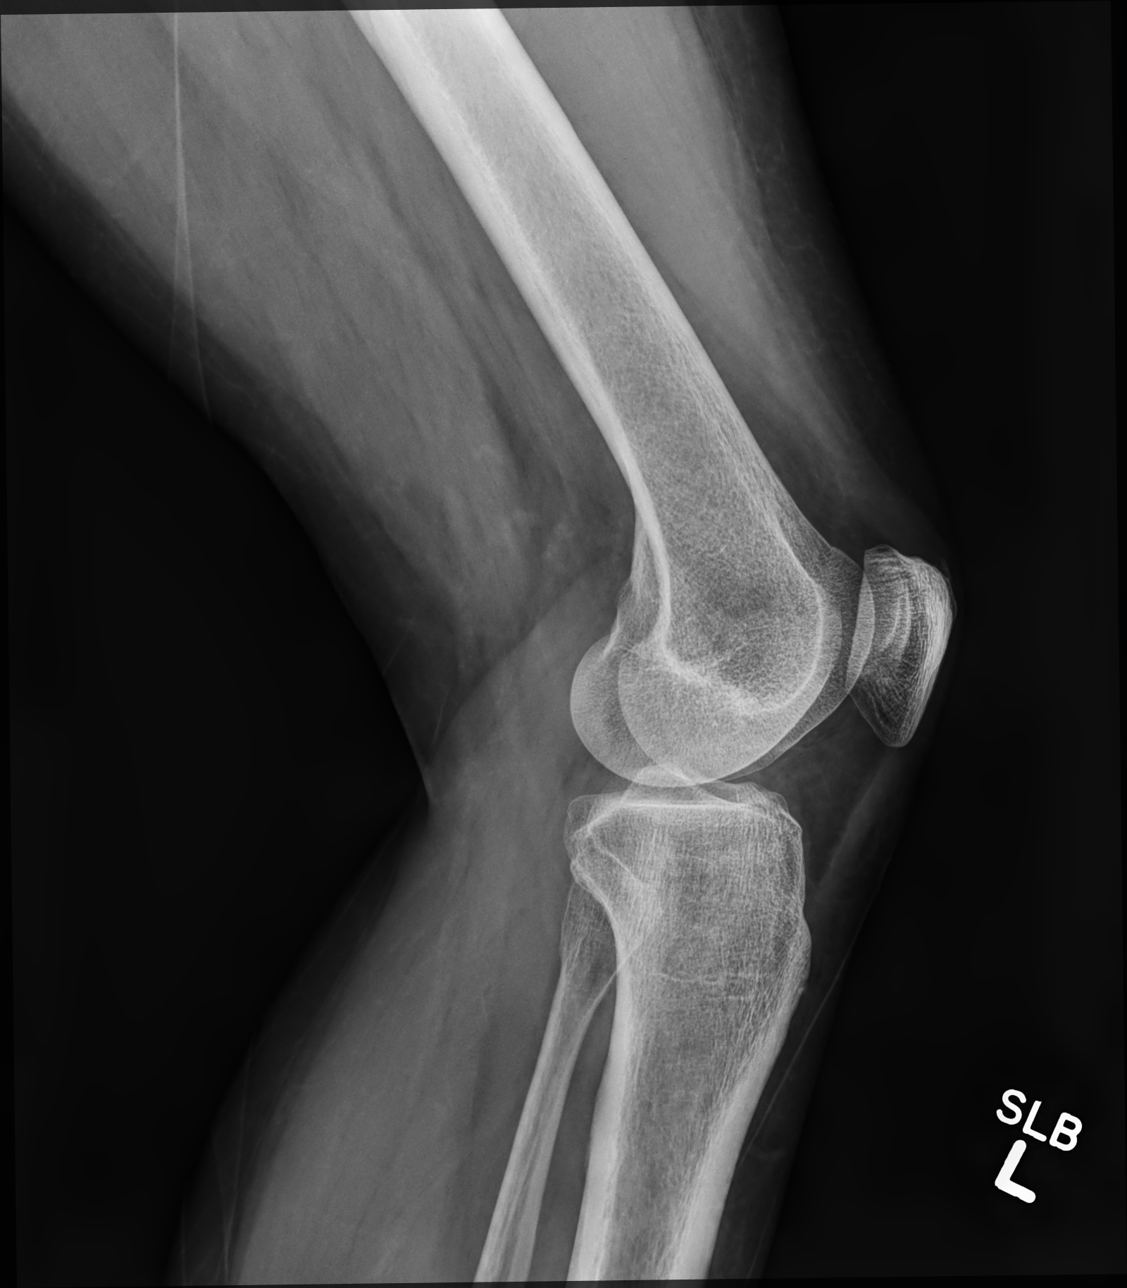

[4 of 4 positions shown; findings below may reference images not displayed]

FINDINGS: No evidence of fracture, dislocation, or joint effusion. No evidence
of arthropathy or other focal bone abnormality. Soft tissues are
unremarkable.
IMPRESSION: Negative.

## 2022-09-05 ENCOUNTER — Other Ambulatory Visit: Payer: Self-pay | Admitting: Obstetrics and Gynecology

## 2022-09-05 DIAGNOSIS — R928 Other abnormal and inconclusive findings on diagnostic imaging of breast: Secondary | ICD-10-CM

## 2022-09-19 ENCOUNTER — Ambulatory Visit
Admission: RE | Admit: 2022-09-19 | Discharge: 2022-09-19 | Disposition: A | Discharge status: Home / Self Care | Payer: BC Managed Care – PPO | Source: Ambulatory Visit | Attending: Obstetrics and Gynecology | Admitting: Obstetrics and Gynecology

## 2022-09-19 ENCOUNTER — Ambulatory Visit: Payer: BC Managed Care – PPO

## 2022-09-19 DIAGNOSIS — R928 Other abnormal and inconclusive findings on diagnostic imaging of breast: Secondary | ICD-10-CM
